# Patient Record
Sex: Male | Born: 1980 | Race: White | Hispanic: No | Marital: Married | Smoking: Never smoker
Health system: Southern US, Community
[De-identification: ages and names within clinical notes are randomized; demographics above are authoritative.]

## PROBLEM LIST (undated history)

## (undated) DIAGNOSIS — Z8701 Personal history of pneumonia (recurrent): Secondary | ICD-10-CM

## (undated) DIAGNOSIS — R569 Unspecified convulsions: Secondary | ICD-10-CM

## (undated) DIAGNOSIS — I10 Essential (primary) hypertension: Secondary | ICD-10-CM

## (undated) DIAGNOSIS — Z87442 Personal history of urinary calculi: Secondary | ICD-10-CM

## (undated) DIAGNOSIS — S83289A Other tear of lateral meniscus, current injury, unspecified knee, initial encounter: Secondary | ICD-10-CM

## (undated) DIAGNOSIS — Z8709 Personal history of other diseases of the respiratory system: Secondary | ICD-10-CM

## (undated) DIAGNOSIS — M94261 Chondromalacia, right knee: Secondary | ICD-10-CM

## (undated) DIAGNOSIS — K5792 Diverticulitis of intestine, part unspecified, without perforation or abscess without bleeding: Secondary | ICD-10-CM

## (undated) HISTORY — PX: COLONOSCOPY: SHX174

---

## 1999-05-12 HISTORY — PX: CHOLECYSTECTOMY: SHX55

## 1999-05-12 HISTORY — PX: KNEE ARTHROSCOPY W/ MENISCAL REPAIR: SHX1877

## 2011-11-09 DIAGNOSIS — K5792 Diverticulitis of intestine, part unspecified, without perforation or abscess without bleeding: Secondary | ICD-10-CM

## 2011-11-09 HISTORY — DX: Diverticulitis of intestine, part unspecified, without perforation or abscess without bleeding: K57.92

## 2012-09-10 DIAGNOSIS — Z8701 Personal history of pneumonia (recurrent): Secondary | ICD-10-CM

## 2012-09-10 HISTORY — DX: Personal history of pneumonia (recurrent): Z87.01

## 2013-01-31 DIAGNOSIS — R569 Unspecified convulsions: Secondary | ICD-10-CM

## 2013-01-31 HISTORY — DX: Unspecified convulsions: R56.9

## 2013-02-10 ENCOUNTER — Encounter (HOSPITAL_COMMUNITY): Payer: Self-pay | Admitting: Pharmacy Technician

## 2013-02-11 ENCOUNTER — Encounter (HOSPITAL_COMMUNITY): Payer: Self-pay

## 2013-02-11 ENCOUNTER — Encounter (HOSPITAL_COMMUNITY)
Admission: RE | Admit: 2013-02-11 | Discharge: 2013-02-11 | Disposition: A | Discharge status: Home / Self Care | Payer: BC Managed Care – PPO | Source: Ambulatory Visit | Attending: Orthopedic Surgery | Admitting: Orthopedic Surgery

## 2013-02-11 HISTORY — DX: Unspecified convulsions: R56.9

## 2013-02-11 HISTORY — DX: Diverticulitis of intestine, part unspecified, without perforation or abscess without bleeding: K57.92

## 2013-02-11 HISTORY — DX: Personal history of urinary calculi: Z87.442

## 2013-02-11 HISTORY — DX: Essential (primary) hypertension: I10

## 2013-02-11 LAB — SURGICAL PCR SCREEN: Staphylococcus aureus: NEGATIVE

## 2013-02-11 LAB — APTT: aPTT: 29 seconds (ref 24–37)

## 2013-02-11 LAB — CBC WITH DIFFERENTIAL/PLATELET
Basophils Absolute: 0 10*3/uL (ref 0.0–0.1)
Basophils Relative: 0 % (ref 0–1)
Hemoglobin: 15 g/dL (ref 13.0–17.0)
MCHC: 35 g/dL (ref 30.0–36.0)
Neutro Abs: 4.4 10*3/uL (ref 1.7–7.7)
Neutrophils Relative %: 55 % (ref 43–77)
RDW: 11.7 % (ref 11.5–15.5)
WBC: 8.1 10*3/uL (ref 4.0–10.5)

## 2013-02-11 LAB — TYPE AND SCREEN

## 2013-02-11 LAB — URINALYSIS, ROUTINE W REFLEX MICROSCOPIC
Ketones, ur: NEGATIVE mg/dL
Leukocytes, UA: NEGATIVE
Nitrite: NEGATIVE
Specific Gravity, Urine: 1.026 (ref 1.005–1.030)
pH: 6 (ref 5.0–8.0)

## 2013-02-11 LAB — COMPREHENSIVE METABOLIC PANEL
ALT: 53 U/L (ref 0–53)
AST: 23 U/L (ref 0–37)
Albumin: 3.8 g/dL (ref 3.5–5.2)
Alkaline Phosphatase: 83 U/L (ref 39–117)
Chloride: 101 mEq/L (ref 96–112)
Potassium: 3.8 mEq/L (ref 3.5–5.1)
Total Bilirubin: 0.2 mg/dL — ABNORMAL LOW (ref 0.3–1.2)

## 2013-02-11 MED ORDER — CHLORHEXIDINE GLUCONATE 4 % EX LIQD: 60.0000 mL | Freq: Once | CUTANEOUS | Status: DC

## 2013-02-11 NOTE — Progress Notes (Signed)
Requested orders from [place] at [facility] office

## 2013-02-11 NOTE — Progress Notes (Signed)
Pt doesn't have a cardiologist  Denies ever having an echo/heart cath/stress test/  Dr.Dough is [facility] in [place]  CXR on May 24,2014 in [place]

## 2013-02-11 NOTE — Pre-Procedure Instructions (Signed)
[place] E Heinz  02/11/2013   Your procedure is scheduled on:  Thurs, June 5 @ 8:00 AM  Report to [facility] at 6:00 AM.  Call this number if you have problems the morning of surgery: [phone]   Remember:   Do not eat food or drink liquids after midnight.   Take these medicines the morning of surgery with A SIP OF WATER: Pain Pill(if needed)   Do not wear jewelry  Do not wear lotions, powders, or colognes. You may wear deodorant.  Men may shave face and neck.  Do not bring valuables to the hospital.  [facility] is not responsible                   for any belongings or valuables.  Contacts, dentures or bridgework may not be worn into surgery.  Leave suitcase in the car. After surgery it may be brought to your room.  For patients admitted to the hospital, checkout time is 11:00 AM the day of  discharge.   Patients discharged the day of surgery will not be allowed to drive  home.    Special Instructions: Shower using CHG 2 nights before surgery and the night before surgery.  If you shower the day of surgery use CHG.  Use special wash - you have one bottle of CHG for all showers.  You should use approximately 1/3 of the bottle for each shower.   Please read over the following fact sheets that you were given: Pain Booklet, Coughing and Deep Breathing, MRSA Information and Surgical Site Infection Prevention

## 2013-02-12 ENCOUNTER — Ambulatory Visit (HOSPITAL_COMMUNITY): Payer: BC Managed Care – PPO | Admitting: Anesthesiology

## 2013-02-12 ENCOUNTER — Inpatient Hospital Stay (HOSPITAL_COMMUNITY): Payer: BC Managed Care – PPO

## 2013-02-12 ENCOUNTER — Inpatient Hospital Stay (HOSPITAL_COMMUNITY)
Admission: RE | Admit: 2013-02-12 | Discharge: 2013-02-15 | DRG: 218 | Disposition: A | Discharge status: Home / Self Care | Payer: BC Managed Care – PPO | Source: Ambulatory Visit | Attending: Orthopedic Surgery | Admitting: Orthopedic Surgery

## 2013-02-12 ENCOUNTER — Encounter (HOSPITAL_COMMUNITY): Payer: Self-pay | Admitting: *Deleted

## 2013-02-12 ENCOUNTER — Encounter (HOSPITAL_COMMUNITY): Payer: Self-pay | Admitting: Anesthesiology

## 2013-02-12 ENCOUNTER — Encounter (HOSPITAL_COMMUNITY)
Admission: RE | Disposition: A | Discharge status: Home / Self Care | Payer: Self-pay | Source: Ambulatory Visit | Attending: Orthopedic Surgery

## 2013-02-12 DIAGNOSIS — Y999 Unspecified external cause status: Secondary | ICD-10-CM

## 2013-02-12 DIAGNOSIS — E871 Hypo-osmolality and hyponatremia: Secondary | ICD-10-CM | POA: Diagnosis present

## 2013-02-12 DIAGNOSIS — R404 Transient alteration of awareness: Secondary | ICD-10-CM | POA: Diagnosis present

## 2013-02-12 DIAGNOSIS — S82141A Displaced bicondylar fracture of right tibia, initial encounter for closed fracture: Secondary | ICD-10-CM

## 2013-02-12 DIAGNOSIS — S82143A Displaced bicondylar fracture of unspecified tibia, initial encounter for closed fracture: Secondary | ICD-10-CM

## 2013-02-12 DIAGNOSIS — M25519 Pain in unspecified shoulder: Secondary | ICD-10-CM | POA: Diagnosis present

## 2013-02-12 DIAGNOSIS — Z7982 Long term (current) use of aspirin: Secondary | ICD-10-CM

## 2013-02-12 DIAGNOSIS — S82109A Unspecified fracture of upper end of unspecified tibia, initial encounter for closed fracture: Principal | ICD-10-CM | POA: Diagnosis present

## 2013-02-12 DIAGNOSIS — Z8709 Personal history of other diseases of the respiratory system: Secondary | ICD-10-CM

## 2013-02-12 DIAGNOSIS — I10 Essential (primary) hypertension: Secondary | ICD-10-CM

## 2013-02-12 DIAGNOSIS — Y9239 Other specified sports and athletic area as the place of occurrence of the external cause: Secondary | ICD-10-CM

## 2013-02-12 HISTORY — PX: ORIF TIBIA PLATEAU: SHX2132

## 2013-02-12 HISTORY — PX: FASCIOTOMY: SHX132

## 2013-02-12 HISTORY — PX: ORIF TIBIA & FIBULA FRACTURES: SHX2131

## 2013-02-12 LAB — CBC
HCT: 36.7 % — ABNORMAL LOW (ref 39.0–52.0)
Hemoglobin: 13 g/dL (ref 13.0–17.0)
MCH: 28.6 pg (ref 26.0–34.0)
MCHC: 35.4 g/dL (ref 30.0–36.0)
MCV: 80.7 fL (ref 78.0–100.0)
WBC: 9.4 10*3/uL (ref 4.0–10.5)

## 2013-02-12 LAB — CREATININE, SERUM: Creatinine, Ser: 0.71 mg/dL (ref 0.50–1.35)

## 2013-02-12 SURGERY — OPEN REDUCTION INTERNAL FIXATION (ORIF) TIBIAL PLATEAU
Anesthesia: General | Site: Leg Lower | Laterality: Right | Wound class: Clean

## 2013-02-12 MED ORDER — ONDANSETRON HCL 4 MG PO TABS: 4.0000 mg | ORAL_TABLET | Freq: Four times a day (QID) | ORAL | Status: DC | PRN

## 2013-02-12 MED ORDER — OXYCODONE HCL 5 MG PO TABS
5.0000 mg | ORAL_TABLET | Freq: Once | ORAL | Status: AC | PRN
  Administered 2013-02-12: 5 mg via ORAL

## 2013-02-12 MED ORDER — METHOCARBAMOL 500 MG PO TABS
1000.0000 mg | ORAL_TABLET | Freq: Four times a day (QID) | ORAL | Status: DC
  Administered 2013-02-12 – 2013-02-15 (×12): 1000 mg via ORAL
  Filled 2013-02-12 (×18): qty 2

## 2013-02-12 MED ORDER — NALOXONE HCL 0.4 MG/ML IJ SOLN: 0.4000 mg | INTRAMUSCULAR | Status: DC | PRN

## 2013-02-12 MED ORDER — PROPOFOL 10 MG/ML IV BOLUS
INTRAVENOUS | Status: DC | PRN
  Administered 2013-02-12: 50 mg via INTRAVENOUS
  Administered 2013-02-12: 150 mg via INTRAVENOUS

## 2013-02-12 MED ORDER — POLYETHYLENE GLYCOL 3350 17 G PO PACK
17.0000 g | PACK | Freq: Every day | ORAL | Status: DC
  Administered 2013-02-14 – 2013-02-15 (×2): 17 g via ORAL
  Filled 2013-02-12 (×4): qty 1

## 2013-02-12 MED ORDER — DIPHENHYDRAMINE HCL 50 MG/ML IJ SOLN: 12.5000 mg | Freq: Four times a day (QID) | INTRAMUSCULAR | Status: DC | PRN

## 2013-02-12 MED ORDER — METOCLOPRAMIDE HCL 5 MG/ML IJ SOLN: 5.0000 mg | Freq: Three times a day (TID) | INTRAMUSCULAR | Status: DC | PRN

## 2013-02-12 MED ORDER — GLYCOPYRROLATE 0.2 MG/ML IJ SOLN
INTRAMUSCULAR | Status: DC | PRN
  Administered 2013-02-12: 0.4 mg via INTRAVENOUS

## 2013-02-12 MED ORDER — SALINE SPRAY 0.65 % NA SOLN
1.0000 | NASAL | Status: DC | PRN
Start: 2013-02-12 — End: 2013-02-15
  Filled 2013-02-12: qty 44

## 2013-02-12 MED ORDER — OXYCODONE HCL 5 MG/5ML PO SOLN: 5.0000 mg | Freq: Once | ORAL | Status: AC | PRN

## 2013-02-12 MED ORDER — CEFAZOLIN SODIUM-DEXTROSE 2-3 GM-% IV SOLR
2.0000 g | INTRAVENOUS | Status: AC
  Administered 2013-02-12: 2 g via INTRAVENOUS

## 2013-02-12 MED ORDER — SORBITOL 70 % SOLN: 30.0000 mL | Freq: Every day | Status: DC | PRN

## 2013-02-12 MED ORDER — LACTATED RINGERS IV SOLN
INTRAVENOUS | Status: DC | PRN
  Administered 2013-02-12 (×3): via INTRAVENOUS

## 2013-02-12 MED ORDER — HYDROMORPHONE HCL PF 1 MG/ML IJ SOLN
0.2500 mg | INTRAMUSCULAR | Status: DC | PRN
  Administered 2013-02-12 (×6): 0.5 mg via INTRAVENOUS

## 2013-02-12 MED ORDER — ONDANSETRON HCL 4 MG/2ML IJ SOLN
INTRAMUSCULAR | Status: DC | PRN
  Administered 2013-02-12: 4 mg via INTRAVENOUS

## 2013-02-12 MED ORDER — HYDROMORPHONE 0.3 MG/ML IV SOLN
INTRAVENOUS | Status: AC
  Filled 2013-02-12: qty 25

## 2013-02-12 MED ORDER — CEFAZOLIN SODIUM-DEXTROSE 2-3 GM-% IV SOLR
2.0000 g | Freq: Four times a day (QID) | INTRAVENOUS | Status: AC
  Administered 2013-02-12 – 2013-02-13 (×3): 2 g via INTRAVENOUS
  Filled 2013-02-12 (×3): qty 50

## 2013-02-12 MED ORDER — HYDROMORPHONE 0.3 MG/ML IV SOLN
INTRAVENOUS | Status: DC
  Administered 2013-02-12 (×2): via INTRAVENOUS
  Administered 2013-02-12: 2.7 mg via INTRAVENOUS
  Administered 2013-02-13: 04:00:00 via INTRAVENOUS
  Administered 2013-02-13: 3.6 mg via INTRAVENOUS
  Administered 2013-02-13: 2.7 mg via INTRAVENOUS
  Filled 2013-02-12 (×2): qty 25

## 2013-02-12 MED ORDER — ENOXAPARIN SODIUM 40 MG/0.4ML ~~LOC~~ SOLN
40.0000 mg | SUBCUTANEOUS | Status: DC
  Administered 2013-02-12 – 2013-02-14 (×3): 40 mg via SUBCUTANEOUS
  Filled 2013-02-12 (×4): qty 0.4

## 2013-02-12 MED ORDER — NEOSTIGMINE METHYLSULFATE 1 MG/ML IJ SOLN
INTRAMUSCULAR | Status: DC | PRN
  Administered 2013-02-12: 3 mg via INTRAVENOUS

## 2013-02-12 MED ORDER — ONDANSETRON HCL 4 MG/2ML IJ SOLN
4.0000 mg | Freq: Four times a day (QID) | INTRAMUSCULAR | Status: DC | PRN
  Administered 2013-02-12 – 2013-02-13 (×2): 4 mg via INTRAVENOUS
  Filled 2013-02-12 (×2): qty 2

## 2013-02-12 MED ORDER — METHOCARBAMOL 100 MG/ML IJ SOLN
1000.0000 mg | Freq: Four times a day (QID) | INTRAVENOUS | Status: DC
  Filled 2013-02-12 (×16): qty 10

## 2013-02-12 MED ORDER — OXYCODONE HCL 5 MG PO TABS
5.0000 mg | ORAL_TABLET | ORAL | Status: DC | PRN
  Administered 2013-02-13 (×4): 15 mg via ORAL
  Administered 2013-02-14: 10 mg via ORAL
  Administered 2013-02-14: 15 mg via ORAL
  Filled 2013-02-12: qty 3
  Filled 2013-02-12: qty 2
  Filled 2013-02-12 (×4): qty 3

## 2013-02-12 MED ORDER — MIDAZOLAM HCL 5 MG/5ML IJ SOLN
INTRAMUSCULAR | Status: DC | PRN
  Administered 2013-02-12: 2 mg via INTRAVENOUS

## 2013-02-12 MED ORDER — HYDROMORPHONE HCL PF 1 MG/ML IJ SOLN
INTRAMUSCULAR | Status: AC
  Filled 2013-02-12: qty 1

## 2013-02-12 MED ORDER — LACTATED RINGERS IV SOLN: INTRAVENOUS | Status: DC

## 2013-02-12 MED ORDER — LIDOCAINE HCL (CARDIAC) 20 MG/ML IV SOLN
INTRAVENOUS | Status: DC | PRN
  Administered 2013-02-12: 100 mg via INTRAVENOUS

## 2013-02-12 MED ORDER — DIPHENHYDRAMINE HCL 12.5 MG/5ML PO ELIX: 12.5000 mg | ORAL_SOLUTION | ORAL | Status: DC | PRN

## 2013-02-12 MED ORDER — CEFAZOLIN SODIUM-DEXTROSE 2-3 GM-% IV SOLR
INTRAVENOUS | Status: AC
  Filled 2013-02-12: qty 50

## 2013-02-12 MED ORDER — FENTANYL CITRATE 0.05 MG/ML IJ SOLN
INTRAMUSCULAR | Status: DC | PRN
  Administered 2013-02-12: 150 ug via INTRAVENOUS
  Administered 2013-02-12 (×6): 50 ug via INTRAVENOUS

## 2013-02-12 MED ORDER — DIPHENHYDRAMINE HCL 12.5 MG/5ML PO ELIX: 12.5000 mg | ORAL_SOLUTION | Freq: Four times a day (QID) | ORAL | Status: DC | PRN

## 2013-02-12 MED ORDER — OXYCODONE HCL 5 MG PO TABS
ORAL_TABLET | ORAL | Status: AC
  Filled 2013-02-12: qty 1

## 2013-02-12 MED ORDER — ACETAMINOPHEN 10 MG/ML IV SOLN
1000.0000 mg | Freq: Four times a day (QID) | INTRAVENOUS | Status: DC
  Administered 2013-02-12: 1000 mg via INTRAVENOUS
  Filled 2013-02-12: qty 100

## 2013-02-12 MED ORDER — ROCURONIUM BROMIDE 100 MG/10ML IV SOLN
INTRAVENOUS | Status: DC | PRN
  Administered 2013-02-12 (×2): 10 mg via INTRAVENOUS
  Administered 2013-02-12: 50 mg via INTRAVENOUS

## 2013-02-12 MED ORDER — METOCLOPRAMIDE HCL 10 MG PO TABS: 5.0000 mg | ORAL_TABLET | Freq: Three times a day (TID) | ORAL | Status: DC | PRN

## 2013-02-12 MED ORDER — SALINE NASAL SPRAY 0.65 % NA SOLN: 1.0000 | NASAL | Status: DC | PRN

## 2013-02-12 MED ORDER — PROMETHAZINE HCL 25 MG/ML IJ SOLN: 6.2500 mg | INTRAMUSCULAR | Status: DC | PRN

## 2013-02-12 MED ORDER — POTASSIUM CHLORIDE IN NACL 20-0.9 MEQ/L-% IV SOLN
INTRAVENOUS | Status: DC
  Administered 2013-02-12 (×2): via INTRAVENOUS
  Filled 2013-02-12 (×2): qty 1000

## 2013-02-12 MED ORDER — DOCUSATE SODIUM 100 MG PO CAPS
100.0000 mg | ORAL_CAPSULE | Freq: Two times a day (BID) | ORAL | Status: DC
  Administered 2013-02-12 – 2013-02-15 (×7): 100 mg via ORAL
  Filled 2013-02-12 (×8): qty 1

## 2013-02-12 MED ORDER — 0.9 % SODIUM CHLORIDE (POUR BTL) OPTIME
TOPICAL | Status: DC | PRN
  Administered 2013-02-12: 1000 mL

## 2013-02-12 MED ORDER — ACETAMINOPHEN 10 MG/ML IV SOLN
1000.0000 mg | Freq: Four times a day (QID) | INTRAVENOUS | Status: AC
  Administered 2013-02-12 – 2013-02-13 (×3): 1000 mg via INTRAVENOUS
  Filled 2013-02-12 (×3): qty 100

## 2013-02-12 MED ORDER — TEMAZEPAM 15 MG PO CAPS
15.0000 mg | ORAL_CAPSULE | Freq: Every evening | ORAL | Status: DC | PRN
  Administered 2013-02-14: 15 mg via ORAL
  Filled 2013-02-12: qty 1

## 2013-02-12 MED ORDER — ACETAMINOPHEN 10 MG/ML IV SOLN
INTRAVENOUS | Status: AC
  Filled 2013-02-12: qty 100

## 2013-02-12 MED ORDER — ONDANSETRON HCL 4 MG/2ML IJ SOLN: 4.0000 mg | Freq: Four times a day (QID) | INTRAMUSCULAR | Status: DC | PRN

## 2013-02-12 MED ORDER — SODIUM CHLORIDE 0.9 % IJ SOLN: 9.0000 mL | INTRAMUSCULAR | Status: DC | PRN

## 2013-02-12 SURGICAL SUPPLY — 98 items
BANDAGE ELASTIC 4 VELCRO ST LF (GAUZE/BANDAGES/DRESSINGS) ×2 IMPLANT
BANDAGE ELASTIC 6 VELCRO ST LF (GAUZE/BANDAGES/DRESSINGS) ×2 IMPLANT
BANDAGE ESMARK 6X9 LF (GAUZE/BANDAGES/DRESSINGS) ×1 IMPLANT
BANDAGE GAUZE ELAST BULKY 4 IN (GAUZE/BANDAGES/DRESSINGS) ×2 IMPLANT
BIT DRILL 100X2.5XANTM LCK (BIT) ×2 IMPLANT
BIT DRILL CAL (BIT) ×1 IMPLANT
BIT DRL 100X2.5XANTM LCK (BIT) ×2
BLADE SURG 10 STRL SS (BLADE) ×2 IMPLANT
BLADE SURG 15 STRL LF DISP TIS (BLADE) IMPLANT
BLADE SURG 15 STRL SS (BLADE)
BLADE SURG ROTATE 9660 (MISCELLANEOUS) ×2 IMPLANT
BNDG COHESIVE 4X5 TAN STRL (GAUZE/BANDAGES/DRESSINGS) ×2 IMPLANT
BNDG ESMARK 6X9 LF (GAUZE/BANDAGES/DRESSINGS) ×2
BRUSH SCRUB DISP (MISCELLANEOUS) ×4 IMPLANT
CANISTER WOUND CARE 500ML ATS (WOUND CARE) IMPLANT
CEMENT CAL PHOSPHATE 5CC (Cement) ×2 IMPLANT
CLOTH BEACON ORANGE TIMEOUT ST (SAFETY) ×2 IMPLANT
COVER MAYO STAND STRL (DRAPES) IMPLANT
COVER SURGICAL LIGHT HANDLE (MISCELLANEOUS) ×2 IMPLANT
CUFF TOURNIQUET SINGLE 24IN (TOURNIQUET CUFF) IMPLANT
CUFF TOURNIQUET SINGLE 34IN LL (TOURNIQUET CUFF) ×2 IMPLANT
DRAPE C-ARM 42X72 X-RAY (DRAPES) ×2 IMPLANT
DRAPE C-ARMOR (DRAPES) ×2 IMPLANT
DRAPE INCISE IOBAN 66X45 STRL (DRAPES) IMPLANT
DRAPE ORTHO SPLIT 77X108 STRL (DRAPES)
DRAPE SURG ORHT 6 SPLT 77X108 (DRAPES) IMPLANT
DRAPE U-SHAPE 47X51 STRL (DRAPES) ×2 IMPLANT
DRILL BIT 2.5MM (BIT) ×2
DRILL BIT CAL (BIT) ×2
DRSG ADAPTIC 3X8 NADH LF (GAUZE/BANDAGES/DRESSINGS) ×2 IMPLANT
DRSG PAD ABDOMINAL 8X10 ST (GAUZE/BANDAGES/DRESSINGS) ×4 IMPLANT
DRSG VAC ATS LRG SENSATRAC (GAUZE/BANDAGES/DRESSINGS) IMPLANT
DRSG VAC ATS MED SENSATRAC (GAUZE/BANDAGES/DRESSINGS) IMPLANT
DRSG VAC ATS SM SENSATRAC (GAUZE/BANDAGES/DRESSINGS) IMPLANT
ELECT REM PT RETURN 9FT ADLT (ELECTROSURGICAL) ×2
ELECTRODE REM PT RTRN 9FT ADLT (ELECTROSURGICAL) ×1 IMPLANT
EVACUATOR 1/8 PVC DRAIN (DRAIN) ×2 IMPLANT
EVACUATOR 3/16  PVC DRAIN (DRAIN)
EVACUATOR 3/16 PVC DRAIN (DRAIN) IMPLANT
GLOVE BIO SURGEON STRL SZ7.5 (GLOVE) ×2 IMPLANT
GLOVE BIO SURGEON STRL SZ8 (GLOVE) ×2 IMPLANT
GLOVE BIOGEL PI IND STRL 7.5 (GLOVE) ×1 IMPLANT
GLOVE BIOGEL PI IND STRL 8 (GLOVE) ×1 IMPLANT
GLOVE BIOGEL PI INDICATOR 7.5 (GLOVE) ×1
GLOVE BIOGEL PI INDICATOR 8 (GLOVE) ×1
GOWN PREVENTION PLUS XLARGE (GOWN DISPOSABLE) ×2 IMPLANT
GOWN STRL NON-REIN LRG LVL3 (GOWN DISPOSABLE) ×4 IMPLANT
IMMOBILIZER KNEE 22 UNIV (SOFTGOODS) ×2 IMPLANT
K-WIRE ACE 1.6X6 (WIRE) ×4
KIT BASIN OR (CUSTOM PROCEDURE TRAY) ×2 IMPLANT
KIT ROOM TURNOVER OR (KITS) ×2 IMPLANT
KWIRE ACE 1.6X6 (WIRE) ×2 IMPLANT
MANIFOLD NEPTUNE II (INSTRUMENTS) ×2 IMPLANT
NDL SUT 6 .5 CRC .975X.05 MAYO (NEEDLE) ×1 IMPLANT
NEEDLE 22X1 1/2 (OR ONLY) (NEEDLE) IMPLANT
NEEDLE MAYO TAPER (NEEDLE) ×1
NS IRRIG 1000ML POUR BTL (IV SOLUTION) ×2 IMPLANT
PACK GENERAL/GYN (CUSTOM PROCEDURE TRAY) IMPLANT
PACK ORTHO EXTREMITY (CUSTOM PROCEDURE TRAY) ×2 IMPLANT
PAD ARMBOARD 7.5X6 YLW CONV (MISCELLANEOUS) ×4 IMPLANT
PAD CAST 4YDX4 CTTN HI CHSV (CAST SUPPLIES) IMPLANT
PADDING CAST COTTON 4X4 STRL (CAST SUPPLIES)
PADDING CAST COTTON 6X4 STRL (CAST SUPPLIES) IMPLANT
PLATE LOCK 5H STD RT PROX TIB (Plate) ×2 IMPLANT
SCREW CORTICAL 3.5MM  44MM (Screw) ×1 IMPLANT
SCREW CORTICAL 3.5MM 36MM (Screw) ×4 IMPLANT
SCREW CORTICAL 3.5MM 44MM (Screw) ×1 IMPLANT
SCREW LOCK CORT STAR 3.5X50 (Screw) ×2 IMPLANT
SCREW LOCK CORT STAR 3.5X65 (Screw) ×2 IMPLANT
SCREW LOCK CORT STAR 3.5X75 (Screw) ×4 IMPLANT
SCREW LOW PROF CORTICAL 3.5X80 (Screw) ×4 IMPLANT
SCREW LP 3.5X85MM (Screw) ×2 IMPLANT
SET MONITOR QUICK PRESSURE (MISCELLANEOUS) IMPLANT
SPONGE GAUZE 4X4 12PLY (GAUZE/BANDAGES/DRESSINGS) ×2 IMPLANT
SPONGE LAP 18X18 X RAY DECT (DISPOSABLE) ×4 IMPLANT
STAPLER VISISTAT 35W (STAPLE) IMPLANT
STOCKINETTE IMPERVIOUS 9X36 MD (GAUZE/BANDAGES/DRESSINGS) IMPLANT
STOCKINETTE IMPERVIOUS LG (DRAPES) ×2 IMPLANT
SUCTION FRAZIER TIP 10 FR DISP (SUCTIONS) ×2 IMPLANT
SUT ETHILON 2 0 FSLX (SUTURE) IMPLANT
SUT ETHILON 3 0 PS 1 (SUTURE) ×4 IMPLANT
SUT PROLENE 0 CT 2 (SUTURE) ×4 IMPLANT
SUT VIC AB 0 CT1 27 (SUTURE) ×1
SUT VIC AB 0 CT1 27XBRD ANBCTR (SUTURE) ×1 IMPLANT
SUT VIC AB 0 CTB1 27 (SUTURE) IMPLANT
SUT VIC AB 1 CT1 27 (SUTURE) ×1
SUT VIC AB 1 CT1 27XBRD ANBCTR (SUTURE) ×1 IMPLANT
SUT VIC AB 2-0 CT1 27 (SUTURE) ×2
SUT VIC AB 2-0 CT1 TAPERPNT 27 (SUTURE) ×2 IMPLANT
SUT VIC AB 2-0 CT3 27 (SUTURE) IMPLANT
SUT VIC AB 2-0 CTB1 (SUTURE) IMPLANT
SYR 20ML ECCENTRIC (SYRINGE) IMPLANT
TOWEL OR 17X24 6PK STRL BLUE (TOWEL DISPOSABLE) ×2 IMPLANT
TOWEL OR 17X26 10 PK STRL BLUE (TOWEL DISPOSABLE) ×4 IMPLANT
TRAY FOLEY CATH 14FR (SET/KITS/TRAYS/PACK) ×2 IMPLANT
TUBE CONNECTING 12X1/4 (SUCTIONS) ×2 IMPLANT
WATER STERILE IRR 1000ML POUR (IV SOLUTION) ×4 IMPLANT
YANKAUER SUCT BULB TIP NO VENT (SUCTIONS) ×2 IMPLANT

## 2013-02-12 NOTE — Progress Notes (Signed)
Orthopedic Tech Progress Note Patient Details:  Charles Swanson 1980/09/15 [id number]  Patient ID: Charles E Paro, male   DOB: 04-11-1981, 32 y.o.   MRN: [id number]   [name] 02/12/2013, 1:23 PM Called [employer] right hinged knee brace.
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
## 2013-02-12 NOTE — Transfer of Care (Signed)
Immediate Anesthesia Transfer of Care Note  Patient: Charles Swanson  Procedure(s) Performed: Procedure(s): OPEN REDUCTION INTERNAL FIXATION (ORIF) RIGHT TIBIAL PLATEAU (Right) ANTERIOR COMPARTMENT FASCIOTOMY (Right)  Patient Location: PACU  Anesthesia Type:General  Level of Consciousness: awake, alert , oriented and patient cooperative  Airway & Oxygen Therapy: Patient Spontanous Breathing and Patient connected to nasal cannula oxygen  Post-op Assessment: Report given to PACU RN, Post -op Vital signs reviewed and stable and Patient moving all extremities X 4  Post vital signs: Reviewed and stable  Complications: No apparent anesthesia complications

## 2013-02-12 NOTE — Brief Op Note (Signed)
02/12/2013  10:37 AM  PATIENT:  [place] E Vigna  32 y.o. male  PRE-OPERATIVE DIAGNOSIS:  RIGHT LATERAL TIBIAL PLATEAU FX   POST-OPERATIVE DIAGNOSIS:  right lateral tibial plateau fracture  PROCEDURE:  Procedure(s): OPEN REDUCTION INTERNAL FIXATION (ORIF) RIGHT TIBIAL PLATEAU (Right) ANTERIOR COMPARTMENT FASCIOTOMY (Right)  SURGEON:  Surgeon(s) and Role:    * Budd Palmer, MD - Primary  PHYSICIAN ASSISTANT: Montez Morita, [facility]  ANESTHESIA:   general  EBL:  Total I/O In: 2000 [I.V.:2000] Out: 225 [Urine:225]  BLOOD ADMINISTERED:none  DRAINS: one anterior compartment   LOCAL MEDICATIONS USED:  NONE  SPECIMEN:  No Specimen  DISPOSITION OF SPECIMEN:  N/A  COUNTS:  YES  TOURNIQUET:   Total Tourniquet Time Documented: Thigh (Right) - 69 minutes Total: Thigh (Right) - 69 minutes   DICTATION: .Other Dictation: Dictation Number [id number]  PLAN OF CARE: Admit to inpatient   PATIENT DISPOSITION:  PACU - hemodynamically stable.   Delay start of Pharmacological VTE agent (>24hrs) due to surgical blood loss or risk of bleeding: no

## 2013-02-12 NOTE — Anesthesia Procedure Notes (Signed)
Procedure Name: Intubation Date/Time: 02/12/2013 8:20 AM Performed by: Rogelia Boga Pre-anesthesia Checklist: Patient identified, Emergency Drugs available, Suction available, Patient being monitored and Timeout performed Patient Re-evaluated:Patient Re-evaluated prior to inductionOxygen Delivery Method: Circle system utilized Preoxygenation: Pre-oxygenation with 100% oxygen Intubation Type: IV induction Ventilation: Mask ventilation without difficulty Laryngoscope Size: Mac and 4 Grade View: Grade I Tube type: Oral Tube size: 7.5 mm Number of attempts: 1 Airway Equipment and Method: Stylet Placement Confirmation: ETT inserted through vocal cords under direct vision,  positive ETCO2 and breath sounds checked- equal and bilateral Secured at: 22 cm Tube secured with: Tape Dental Injury: Teeth and Oropharynx as per pre-operative assessment

## 2013-02-12 NOTE — Preoperative (Signed)
Beta Blockers   Reason not to administer Beta Blockers:Not Applicable 
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
## 2013-02-12 NOTE — Anesthesia Postprocedure Evaluation (Signed)
Anesthesia Post Note  Patient: Charles Swanson  Procedure(s) Performed: Procedure(s) (LRB): OPEN REDUCTION INTERNAL FIXATION (ORIF) RIGHT TIBIAL PLATEAU (Right) ANTERIOR COMPARTMENT FASCIOTOMY (Right)  Anesthesia type: general  Patient location: PACU  Post pain: Pain level controlled  Post assessment: Patient's Cardiovascular Status Stable  Last Vitals:  Filed Vitals:   02/12/13 1130  BP: 161/93  Pulse: 73  Temp:   Resp: 10    Post vital signs: Reviewed and stable  Level of consciousness: sedated  Complications: No apparent anesthesia complications

## 2013-02-12 NOTE — H&P (Signed)
I have seen and examined the patient. I agree with the findings above.  R shoulder pain without effect on breathing; no tenderness, no weakness  I discussed with the patient the risks and benefits of surgery for tibial plateau fracture, including the possibility of infection, nerve injury, vessel injury, wound breakdown, arthritis, symptomatic hardware, DVT/ PE, loss of motion, and need for further surgery among others.  We also specifically discussed the elevated risk of soft tissue breakdown.  He understood these risks and wished to proceed.   Budd Palmer, MD 02/12/2013 8:00 AM

## 2013-02-13 ENCOUNTER — Encounter (HOSPITAL_COMMUNITY): Payer: Self-pay | Admitting: Orthopedic Surgery

## 2013-02-13 DIAGNOSIS — I10 Essential (primary) hypertension: Secondary | ICD-10-CM

## 2013-02-13 DIAGNOSIS — S82143A Displaced bicondylar fracture of unspecified tibia, initial encounter for closed fracture: Secondary | ICD-10-CM

## 2013-02-13 LAB — BASIC METABOLIC PANEL
Calcium: 9.1 mg/dL (ref 8.4–10.5)
Chloride: 97 mEq/L (ref 96–112)
Creatinine, Ser: 0.67 mg/dL (ref 0.50–1.35)
GFR calc Af Amer: >90 mL/min (ref >90)
GFR calc non Af Amer: >90 mL/min (ref >90)

## 2013-02-13 LAB — CBC
Platelets: 328 10*3/uL (ref 150–400)
RDW: 11.9 % (ref 11.5–15.5)
WBC: 11.9 10*3/uL — ABNORMAL HIGH (ref 4.0–10.5)

## 2013-02-13 MED ORDER — ENOXAPARIN SODIUM 40 MG/0.4ML ~~LOC~~ SOLN: 40.0000 mg | Freq: Every day | SUBCUTANEOUS | Status: DC

## 2013-02-13 MED ORDER — OXYCODONE-ACETAMINOPHEN 5-325 MG PO TABS
1.0000 | ORAL_TABLET | Freq: Four times a day (QID) | ORAL | Status: DC | PRN
  Administered 2013-02-13 – 2013-02-15 (×5): 2 via ORAL
  Filled 2013-02-13 (×6): qty 2

## 2013-02-13 MED ORDER — OXYCODONE-ACETAMINOPHEN 10-325 MG PO TABS: 1.0000 | ORAL_TABLET | Freq: Four times a day (QID) | ORAL | Status: DC | PRN

## 2013-02-13 MED ORDER — OXYCODONE HCL 5 MG PO TABS: 5.0000 mg | ORAL_TABLET | Freq: Four times a day (QID) | ORAL | Status: DC | PRN

## 2013-02-13 MED ORDER — ACETAMINOPHEN 325 MG PO TABS
325.0000 mg | ORAL_TABLET | Freq: Four times a day (QID) | ORAL | Status: DC | PRN
  Administered 2013-02-13: 650 mg via ORAL
  Administered 2013-02-14: 325 mg via ORAL
  Filled 2013-02-13: qty 2
  Filled 2013-02-13: qty 1

## 2013-02-13 MED ORDER — OXYCODONE HCL 5 MG PO TABS: 5.0000 mg | ORAL_TABLET | ORAL | Status: DC | PRN

## 2013-02-13 MED ORDER — METHOCARBAMOL 500 MG PO TABS: 500.0000 mg | ORAL_TABLET | Freq: Four times a day (QID) | ORAL | Status: DC

## 2013-02-13 MED ORDER — POLYETHYLENE GLYCOL 3350 17 G PO PACK: 17.0000 g | PACK | Freq: Every day | ORAL | Status: DC

## 2013-02-13 NOTE — Progress Notes (Signed)
Orthopedic Tech Progress Note Patient Details:  Charles Swanson 1981-07-17 [id number]  Patient ID: Charles Swanson, male   DOB: 03-10-1981, 31 y.o.   MRN: [id number]   [name] 02/13/2013, 1:49 PM bledsoe knee brace completed by advanced.
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
## 2013-02-13 NOTE — Progress Notes (Signed)
Orthopaedic Trauma Service Progress Note     1 Day Post-Op  Subjective   Minimal sleep overnight C/o significant pain in R leg Some N/V this am Feeling better now Does not want to go home   Denies CP or SOB No abd pain  No H/A, lightheadedness or vision changes   Denies numbness or tingling   Objective  BP 135/87  Pulse 78  Temp(Src) 98.9 F (37.2 C) (Oral)  Resp 16  SpO2 96%  Intake/Output     06/05 0701 - 06/06 0700 06/06 0701 - 06/07 0700   P.O. 680    I.V. 2800    Total Intake 3480     Urine 1335    Drains 85    Total Output 1420     Net +2060            Labs Results for [place], [place] E (MRN [id number]) as of 02/13/2013 08:48  Ref. Range 02/13/2013 05:50  Sodium Latest Range: 135-145 mEq/L 132 (L)  Potassium Latest Range: 3.5-5.1 mEq/L 4.4  Chloride Latest Range: 96-112 mEq/L 97  CO2 Latest Range: 19-32 mEq/L 24  BUN Latest Range: 6-23 mg/dL 9  Creatinine Latest Range: 0.50-1.35 mg/dL 9.62  Calcium Latest Range: 8.4-10.5 mg/dL 9.1  GFR calc non Af Amer Latest Range: >90 mL/min >90  GFR calc Af Amer Latest Range: >90 mL/min >90  Glucose Latest Range: 70-99 mg/dL 95  WBC Latest Range: 9.5-28.4 K/uL 11.9 (H)  RBC Latest Range: 4.22-5.81 MIL/uL 4.72  Hemoglobin Latest Range: 13.0-17.0 g/dL 13.2  HCT Latest Range: 39.0-52.0 % 38.7 (L)  MCV Latest Range: 78.0-100.0 fL 82.0  MCH Latest Range: 26.0-34.0 pg 28.4  MCHC Latest Range: 30.0-36.0 g/dL 44.0  RDW Latest Range: 11.5-15.5 % 11.9  Platelets Latest Range: 150-400 K/uL 328    Exam  Gen: awake and alert, resting in bed Lungs: clear Cardiac: s1 and s2, RRR Abd: soft, NTND, + BS Ext:           Right Lower Extremity    Dressing c/d/i  Distal motor and sensory functions intact  No pain with passive stretch   Swelling stable  Ext warm  + DP pulse  Compartments soft and NT  Drain patent   Assessment and Plan  1 Day Post-Op  32 y/o male s/p ORIF R tibial plateau  1. R tibial plateau fracture  POD 1   NWB  X 6 weeks  Hinged brace ordered  ROM as tolerate  Ice and elevate  PT/OT consults  Dressing change tomorrow, d/c drain tomorrow   2. HTN  Stable  3. Pain management:  D/c PCA today  Transition to PO meds  4. DVT/PE prophylaxis:  Lovenox  Will d/c home with 14 days of lovenox 5. ID:   Ancef for post op abx coverage  6. Activity:  PT/OT evals  OOB as toleratd  NWB R leg  7. FEN/Foley/Lines:  Hyponatremia- mild, will d/c IVF, NSL IV  Reg diet  8. Dispo:  Possibly home tomorrow    Mearl Latin, PA-C [facility] [phone] (P) 02/13/2013 8:47 AM

## 2013-02-13 NOTE — Discharge Summary (Signed)
[facility] (OTS)  Patient ID: Charles Swanson MRN: [id number] DOB/AGE: 1981/02/24 32 y.o.  Admit date: 02/12/2013 Discharge date: 02/15/2013  Admission Diagnoses: Right Tibial plateau fracture HTN  Discharge Diagnoses:  Principal Problem:   Tibial plateau fracture Active Problems:   HTN (hypertension)   Procedures Performed: 02/12/2013- Dr. Carola Frost 1. Open reduction and internal fixation of right lateral tibial  plateau.  2. Anterior compartment fasciotomy  Discharged Condition: good  Hospital Course:   Pt had an uncomplicated hospital course.  He did well on POD 1 but was having moderate pain and requiring IV pain meds.  He was slow to mobilize with PT due to pain. Pt continued to progress well over the next several days and on POD 3 he was stable for d/c to home.  He was tolerating a regular diet, voiding on his own and mobilizing safely at the time of discharge   Consults: None  Significant Diagnostic Studies:  Labs   Treatments: IV hydration, antibiotics: Ancef, analgesia: acetaminophen, Dilaudid and percocet, oxy IR, anticoagulation: LMW heparin, therapies: PT, OT and RN and surgery: as above   Discharge Exam:   Subjective:  Patient reports pain as mild to moderate.  He states tha he is better today and was able to sleep last night.  Objective:   VITALS:   Filed Vitals:     02/14/13 0655  02/14/13 1428  02/14/13 2054  02/15/13 0612   BP:  139/86  136/81  152/93  127/82   Pulse:  95  108  110  89   Temp:  98.4 F (36.9 C)  99 F (37.2 C)  98.3 F (36.8 C)  98.6 F (37 C)   TempSrc:  Oral  Oral  Oral  Oral   Resp:  18  20  18  18    SpO2:  98%  98%  98%  96%     ABD soft Sensation intact distally Dorsiflexion/Plantar flexion intact Incision: dressing C/D/I and no drainage     Lab Results   Component  Value  Date     WBC  11.0*  02/14/2013     HGB  13.2  02/14/2013     HCT  38.5*  02/14/2013     MCV  82.8  02/14/2013     PLT  335   02/14/2013      Assessment/Plan: 3 Days Post-Op   Principal Problem:   Tibial plateau fracture Active Problems:   HTN (hypertension)   Advance diet Up with therapy Discharge home with home health Meds in chart per Dr Carola Frost and DC per his orders   Haskel Khan 02/15/2013, 9:31 AM   Teryl Lucy, MD Cell [phone]     Disposition: home   Discharge Orders   Future Orders Complete By Expires     Call MD / Call 911  As directed     Comments:      If you experience chest pain or shortness of breath, CALL 911 and be transported to the hospital emergency room.  If you develope a fever above 101 F, pus (white drainage) or increased drainage or redness at the wound, or calf pain, call your surgeon's office.    Constipation Prevention  As directed     Comments:      Drink plenty of fluids.  Prune juice may be helpful.  You may use a stool softener, such as Colace (over the counter) 100 mg twice a day.  Use  MiraLax (over the counter) for constipation as needed.    Diet general  As directed     Discharge instructions  As directed     Comments:      [facility] Discharge Instructions   General Discharge Instructions  WEIGHT BEARING STATUS: Nonweightbearing Right Leg x 6 weeks  RANGE OF MOTION/ACTIVITY: Range of motion Right knee as tolerated. Goal is to get leg to full extension (fully straight) as soon as possible and 90 degrees of flexion (bending) by 4 weeks post op.   Wound care: please see instructions below.  Can start daily dressing changes upon arrival home   Diet: as you were eating previously.  Can use over the counter stool softeners and bowel preparations, such as Miralax, to help with bowel movements.  Narcotics can be constipating.  Be sure to drink plenty of fluids  STOP SMOKING OR USING NICOTINE PRODUCTS!!!!  As discussed nicotine severely impairs your body's ability to heal surgical and traumatic wounds but also impairs bone  healing.  Wounds and bone heal by forming microscopic blood vessels (angiogenesis) and nicotine is a vasoconstrictor (essentially, shrinks blood vessels).  Therefore, if vasoconstriction occurs to these microscopic blood vessels they essentially disappear and are unable to deliver necessary nutrients to the healing tissue.  This is one modifiable factor that you can do to dramatically increase your chances of healing your injury.    (This means no smoking, no nicotine gum, patches, etc)  DO NOT USE NONSTEROIDAL ANTI-INFLAMMATORY DRUGS (NSAID'S)  Using products such as Advil (ibuprofen), Aleve (naproxen), Motrin (ibuprofen) for additional pain control during fracture healing can delay and/or prevent the healing response.  If you would like to take over the counter (OTC) medication, Tylenol (acetaminophen) is ok.  However, some narcotic medications that are given for pain control contain acetaminophen as well. Therefore, you should not exceed more than 4000 mg of tylenol in a day if you do not have liver disease.  Also note that there are may OTC medicines, such as cold medicines and allergy medicines that my contain tylenol as well.  If you have any questions about medications and/or interactions please ask your doctor/PA or your pharmacist.   PAIN MEDICATION USE AND EXPECTATIONS  You have likely been given narcotic medications to help control your pain.  After a traumatic event that results in an fracture (broken bone) with or without surgery, it is ok to use narcotic pain medications to help control one's pain.  We understand that everyone responds to pain differently and each individual patient will be evaluated on a regular basis for the continued need for narcotic medications. Ideally, narcotic medication use should last no more than 6-8 weeks (coinciding with fracture healing).   As a patient it is your responsibility as well to monitor narcotic medication use and report the amount and frequency you use  these medications when you come to your office visit.   We would also advise that if you are using narcotic medications, you should take a dose prior to therapy to maximize you participation.  IF YOU ARE ON NARCOTIC MEDICATIONS IT IS NOT PERMISSIBLE TO OPERATE A MOTOR VEHICLE (MOTORCYCLE/CAR/TRUCK/MOPED) OR HEAVY MACHINERY DO NOT MIX NARCOTICS WITH OTHER CNS (CENTRAL NERVOUS SYSTEM) DEPRESSANTS SUCH AS ALCOHOL       ICE AND ELEVATE INJURED/OPERATIVE EXTREMITY  Using ice and elevating the injured extremity above your heart can help with swelling and pain control.  Icing in a pulsatile fashion, such as 20 minutes on and  20 minutes off, can be followed.    Do not place ice directly on skin. Make sure there is a barrier between to skin and the ice pack.    Using frozen items such as frozen peas works well as the conform nicely to the are that needs to be iced.  USE AN ACE WRAP OR TED HOSE FOR SWELLING CONTROL  In addition to icing and elevation, Ace wraps or TED hose are used to help limit and resolve swelling.  It is recommended to use Ace wraps or TED hose until you are informed to stop.    When using Ace Wraps start the wrapping distally (farthest away from the body) and wrap proximally (closer to the body)   Example: If you had surgery on your leg or thing and you do not have a splint on, start the ace wrap at the toes and work your way up to the thigh        If you had surgery on your upper extremity and do not have a splint on, start the ace wrap at your fingers and work your way up to the upper arm  IF YOU ARE IN A SPLINT OR CAST DO NOT REMOVE IT FOR ANY REASON   If your splint gets wet for any reason please contact the office immediately. You may shower in your splint or cast as long as you keep it dry.  This can be done by wrapping in a cast cover or garbage back (or similar)  Do Not stick any thing down your splint or cast such as pencils, money, or hangers to try and scratch yourself  with.  If you feel itchy take benadryl as prescribed on the bottle for itching  IF YOU ARE IN A CAM BOOT (BLACK BOOT)  You may remove boot periodically. Perform daily dressing changes as noted below.  Wash the liner of the boot regularly and wear a sock when wearing the boot. It is recommended that you sleep in the boot until told otherwise  CALL THE OFFICE WITH ANY QUESTIONS OR CONCERTS: [phone]     Discharge Pin Site Instructions  Dress pins daily with Kerlix roll starting on POD 2. Wrap the Kerlix so that it tamps the skin down around the pin-skin interface to prevent/limit motion of the skin relative to the pin.  (Pin-skin motion is the primary cause of pain and infection related to external fixator pin sites).  Remove any crust or coagulum that may obstruct drainage with a saline moistened gauze or soap and water.  After POD 3, if there is no discernable drainage on the pin site dressing, the interval for change can by increased to every other day.  You may shower with the fixator, cleaning all pin sites gently with soap and water.  If you have a surgical wound this needs to be completely dry and without drainage before showering.  The extremity can be lifted by the fixator to facilitate wound care and transfers.  Notify the office/Doctor if you experience increasing drainage, redness, or pain from a pin site, or if you notice purulent (thick, snot-like) drainage.  Discharge Wound Care Instructions  Do NOT apply any ointments, solutions or lotions to pin sites or surgical wounds.  These prevent needed drainage and even though solutions like hydrogen peroxide kill bacteria, they also damage cells lining the pin sites that help fight infection.  Applying lotions or ointments can keep the wounds moist and can cause them to breakdown and open  up as well. This can increase the risk for infection. When in doubt call the office.  Surgical incisions should be dressed daily.  If any  drainage is noted, use one layer of adaptic, then gauze, Kerlix, and an ace wrap.  Once the incision is completely dry and without drainage, it may be left open to air out.  Showering may begin 36-48 hours later.  Cleaning gently with soap and water.  Traumatic wounds should be dressed daily as well.    One layer of adaptic, gauze, Kerlix, then ace wrap.  The adaptic can be discontinued once the draining has ceased    If you have a wet to dry dressing: wet the gauze with saline the squeeze as much saline out so the gauze is moist (not soaking wet), place moistened gauze over wound, then place a dry gauze over the moist one, followed by Kerlix wrap, then ace wrap.    Do not put a pillow under the knee. Place it under the heel.  As directed     Driving restrictions  As directed     Comments:      No driving    Increase activity slowly as tolerated  As directed     Non weight bearing  As directed         Medication List    STOP taking these medications       aspirin EC 81 MG tablet     cyclobenzaprine 10 MG tablet  Commonly known as:  FLEXERIL     oxyCODONE-acetaminophen 5-325 MG per tablet  Commonly known as:  PERCOCET/ROXICET      TAKE these medications       enoxaparin 40 MG/0.4ML injection  Commonly known as:  LOVENOX  Inject 0.4 mLs (40 mg total) into the skin daily.     methocarbamol 500 MG tablet  Commonly known as:  ROBAXIN  Take 1-2 tablets (500-1,000 mg total) by mouth 4 (four) times daily.     oxyCODONE 5 MG immediate release tablet  Commonly known as:  Oxy IR/ROXICODONE  Take 1-3 tablets (5-15 mg total) by mouth every 3 (three) hours as needed (breakthrough pain between percocet).     oxyCODONE-acetaminophen 10-325 MG per tablet  Commonly known as:  PERCOCET  Take 1-2 tablets by mouth every 6 (six) hours as needed for pain.     polyethylene glycol packet  Commonly known as:  MIRALAX / GLYCOLAX  Take 17 g by mouth daily.     sodium chloride 0.65 % nasal  spray  Commonly known as:  OCEAN  Place 1 spray into the nose as needed for congestion.     STOOL SOFTENER PO  Take 1 tablet by mouth daily.           Follow-up Information   Follow up with HANDY,MICHAEL H, MD. Schedule an appointment as soon as possible for a visit in 14 days. (call for appointment)    Contact information:   [address] [place] [address] [phone]       Discharge Instructions and Plan: Mr. Novacek has sustained a fairly severe injury to the Right knee. We were able to achieve excellent fixation with plate osteosynthesis. It was a technically successful procedure. We were able to restore alignment, assess for meniscal injury, address stability and restored joint surface congruity which are all favorable to a successful outcome. Patient is still at risk for the development of posttraumatic arthritis which has been discussed  at length and we will continue to monitor the patient for signs and symptoms of such. Patient will be nonweightbearing for the next 6-8 weeks. Unrestricted range of motion of the Right knee in the hinged knee brace. total knee precautions should be followed when at rest. He may remove the brace to facilitate ROM exercises We will refer Mr. Schwarz to outpatient physical therapy after the first postoperative visit. He will be on Lovenox for DVT/PE prophylaxis for 2 weeks Daily dressing changes should be performed as per discharge wound care instructions. Patient can resume prehospital diet Patient will be on oxycodone, Percocet, Robaxin for pain control We will see the patient back in the office in 10-14 days for reevaluation, followup x-rays and removal of sutures. Should the patient have any questions for the first postoperative visit and encouraged to contact the office immediately. Patient will be vigilant for any redness increased drainage and increased pain, which are suggestive of infection and  will contact the office immediately. Patient will also be vigilant for fevers or chills or any other concerning signs. They will contact the office if any of these develop.   Signed:  Mearl Latin, PA-C [facility] [phone] (P) 02/13/2013, 9:12 AM
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
## 2013-02-13 NOTE — Progress Notes (Signed)
Dilaudid PCA pump discontinued. Last reading of 1.2mg  noted.  Wasted 2.68mL of Dilaudid with Corrie Dandy, RN in sink then syringe when into the sharp container.
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
## 2013-02-14 LAB — CBC
HCT: 38.5 % — ABNORMAL LOW (ref 39.0–52.0)
Hemoglobin: 13.2 g/dL (ref 13.0–17.0)
WBC: 11 10*3/uL — ABNORMAL HIGH (ref 4.0–10.5)

## 2013-02-14 LAB — BASIC METABOLIC PANEL
Chloride: 97 mEq/L (ref 96–112)
GFR calc Af Amer: >90 mL/min (ref >90)
Potassium: 4.2 mEq/L (ref 3.5–5.1)
Sodium: 135 mEq/L (ref 135–145)

## 2013-02-14 MED ORDER — HYDROMORPHONE HCL 2 MG PO TABS
2.0000 mg | ORAL_TABLET | ORAL | Status: DC | PRN
  Administered 2013-02-14: 2 mg via ORAL
  Filled 2013-02-14: qty 1

## 2013-02-14 MED ORDER — DIAZEPAM 5 MG PO TABS: 5.0000 mg | ORAL_TABLET | Freq: Four times a day (QID) | ORAL | Status: DC | PRN

## 2013-02-14 NOTE — Progress Notes (Signed)
Patient ID: Charles Swanson, male   DOB: [birth year], 32 y.o.   MRN: [id number]     Subjective:  Patient reports pain as mild.  Patient states that he had some increased pain last night and thought that the percocet worked better than the Oxy IR.   Objective:   VITALS:   Filed Vitals:   02/13/13 1630 02/13/13 2100 02/14/13 0655 02/14/13 1428  BP:  127/75 139/86 136/81  Pulse:  109 95 108  Temp: 100.8 F (38.2 C) 99.5 F (37.5 C) 98.4 F (36.9 C) 99 F (37.2 C)  TempSrc:  Oral Oral Oral  Resp:  18 18 20   SpO2:  98% 98% 98%    ABD soft Sensation intact distally Dorsiflexion/Plantar flexion intact Incision: dressing C/D/I and no drainage Drain removed the wound is clean and dry no sign of infection Dry dressing applied   Lab Results  Component Value Date   WBC 11.0* 02/14/2013   HGB 13.2 02/14/2013   HCT 38.5* 02/14/2013   MCV 82.8 02/14/2013   PLT 335 02/14/2013     Assessment/Plan: 2 Days Post-Op   Principal Problem:   Tibial plateau fracture Active Problems:   HTN (hypertension)  Pain is not currently controlled, and we're going to try different formulation to get his pain under control over the course of the next 24 hours.  Advance diet Up with therapy Continue pain meds and plan for DC tomorrow   Mishal Probert P 02/14/2013, 3:04 PM   Teryl Lucy, MD Cell [phone]

## 2013-02-14 NOTE — Progress Notes (Signed)
I saw and examined the patient with Mr. Paul, communicating the findings and plan noted above.  [place]aclara Acklin, MD [facility] [phone] (p)  

## 2013-02-14 NOTE — Progress Notes (Signed)
Physical Therapy Treatment Patient Details Name: Charles Swanson MRN: [id number] DOB: 1980-11-25 Today's Date: 02/14/2013 Time: 1105-1130 PT Time Calculation (min): 25 min  PT Assessment / Plan / Recommendation Comments on Treatment Session  Pt making great progress toward goals with HEP education and stair education completed today.    Follow Up Recommendations  Home health PT;Supervision/Assistance - 24 hour;Supervision for mobility/OOB           Equipment Recommendations  Rolling walker with 5" wheels       Frequency Min 6X/week   Plan Discharge plan remains appropriate;Frequency remains appropriate    Precautions / Restrictions Precautions Precautions: Fall Required Braces or Orthoses: Knee Immobilizer - Right Knee Immobilizer - Right: On at all times Restrictions RLE Weight Bearing: Non weight bearing       Mobility  Bed Mobility Bed Mobility: Not assessed Transfers Sit to Stand: 5: Supervision;From chair/3-in-1;With upper extremity assist;With armrests Stand to Sit: 5: Supervision;To chair/3-in-1;With upper extremity assist;With armrests Details for Transfer Assistance: no cues needed, supervision for safetey only Ambulation/Gait Ambulation/Gait Assistance: 4: Min guard Ambulation Distance (Feet): 20 Feet Assistive device: Crutches Ambulation/Gait Assistance Details: from chair to/from stairs in gym Gait Pattern: Step-to pattern Stairs: Yes Stairs Assistance: 4: Min guard Stair Management Technique: Step to pattern;Forwards;With crutches Number of Stairs: 3    Exercises General Exercises - Lower Extremity Ankle Circles/Pumps: AROM;Both;10 reps;Seated Quad Sets: AROM;Strengthening;Right;10 reps;Seated Short Arc Quad: AROM;Strengthening;Right;5 reps;10 reps;Seated Heel Slides: AAROM;Strengthening;Right;10 reps;Seated Straight Leg Raises: AROM;Strengthening;Right;10 reps;Seated      PT Goals Acute Rehab PT Goals Pt will go Sit to Stand: with modified  independence PT Goal: Sit to Stand - Progress: Progressing toward goal Pt will go Stand to Sit: with modified independence PT Goal: Stand to Sit - Progress: Progressing toward goal Pt will Transfer Bed to Chair/Chair to Bed: with modified independence PT Transfer Goal: Bed to Chair/Chair to Bed - Progress: Progressing toward goal Pt will Ambulate: 51 - 150 feet;with modified independence;with least restrictive assistive device PT Goal: Ambulate - Progress: Progressing toward goal Pt will Go Up / Down Stairs: 1-2 stairs;with min assist;with least restrictive assistive device PT Goal: Up/Down Stairs - Progress: Met  Visit Information  Last PT Received On: 02/14/13 Assistance Needed: +1    Subjective Data  Subjective: No new complaints, agreeable to therapy at this time.   Cognition  Cognition Arousal/Alertness: Awake/alert Behavior During Therapy: WFL for tasks assessed/performed Overall Cognitive Status: Within Functional Limits for tasks assessed       End of Session PT - End of Session Equipment Utilized During Treatment: Gait belt;Right knee immobilizer Activity Tolerance: Patient tolerated treatment well Patient left: in chair;with call bell/phone within reach;Other (comment) Nurse Communication: Mobility status;Weight bearing status   GP     Sallyanne Kuster 02/14/2013, 12:32 PM  Sallyanne Kuster, PTA Office- [phone]

## 2013-02-15 LAB — BASIC METABOLIC PANEL
BUN: 13 mg/dL (ref 6–23)
Chloride: 98 mEq/L (ref 96–112)
GFR calc Af Amer: >90 mL/min (ref >90)
Glucose, Bld: 105 mg/dL — ABNORMAL HIGH (ref 70–99)
Potassium: 4.5 mEq/L (ref 3.5–5.1)
Sodium: 135 mEq/L (ref 135–145)

## 2013-02-15 NOTE — Progress Notes (Signed)
Discharge instructions given. Prescriptions given. Instructions on how to administer Lovenox was given. Patient had no questions.

## 2013-02-15 NOTE — Progress Notes (Signed)
Patient ID: Charles Swanson, male   DOB: [birth year], 32 y.o.   MRN: [id number]     Subjective:  Patient reports pain as mild to moderate.  He states tha he is better today and was able to sleep last night.  Objective:   VITALS:   Filed Vitals:   02/14/13 0655 02/14/13 1428 02/14/13 2054 02/15/13 0612  BP: 139/86 136/81 152/93 127/82  Pulse: 95 108 110 89  Temp: 98.4 F (36.9 C) 99 F (37.2 C) 98.3 F (36.8 C) 98.6 F (37 C)  TempSrc: Oral Oral Oral Oral  Resp: 18 20 18 18   SpO2: 98% 98% 98% 96%    ABD soft Sensation intact distally Dorsiflexion/Plantar flexion intact Incision: dressing C/D/I and no drainage   Lab Results  Component Value Date   WBC 11.0* 02/14/2013   HGB 13.2 02/14/2013   HCT 38.5* 02/14/2013   MCV 82.8 02/14/2013   PLT 335 02/14/2013     Assessment/Plan: 3 Days Post-Op   Principal Problem:   Tibial plateau fracture Active Problems:   HTN (hypertension)   Advance diet Up with therapy Discharge home with home health Meds in chart per Dr Carola Frost and DC per his orders   Charles Swanson, Charles Swanson 02/15/2013, 9:31 AM   Charles Lucy, MD Cell [phone]

## 2013-03-02 ENCOUNTER — Other Ambulatory Visit: Payer: Self-pay | Admitting: Orthopedic Surgery

## 2013-03-02 DIAGNOSIS — H538 Other visual disturbances: Secondary | ICD-10-CM

## 2013-03-05 ENCOUNTER — Ambulatory Visit
Admission: RE | Admit: 2013-03-05 | Discharge: 2013-03-05 | Disposition: A | Discharge status: Home / Self Care | Payer: BC Managed Care – PPO | Source: Ambulatory Visit | Attending: Orthopedic Surgery | Admitting: Orthopedic Surgery

## 2013-03-05 DIAGNOSIS — H538 Other visual disturbances: Secondary | ICD-10-CM

## 2013-03-05 MED ORDER — GADOBENATE DIMEGLUMINE 529 MG/ML IV SOLN
20.0000 mL | Freq: Once | INTRAVENOUS | Status: AC | PRN
  Administered 2013-03-05: 20 mL via INTRAVENOUS

## 2013-03-07 ENCOUNTER — Other Ambulatory Visit: Payer: BC Managed Care – PPO

## 2013-07-11 DIAGNOSIS — S83289A Other tear of lateral meniscus, current injury, unspecified knee, initial encounter: Secondary | ICD-10-CM

## 2013-07-11 DIAGNOSIS — M94261 Chondromalacia, right knee: Secondary | ICD-10-CM

## 2013-07-11 HISTORY — DX: Other tear of lateral meniscus, current injury, unspecified knee, initial encounter: S83.289A

## 2013-07-11 HISTORY — DX: Chondromalacia, right knee: M94.261

## 2013-07-13 ENCOUNTER — Other Ambulatory Visit: Payer: Self-pay | Admitting: Orthopedic Surgery

## 2013-07-29 ENCOUNTER — Encounter (HOSPITAL_BASED_OUTPATIENT_CLINIC_OR_DEPARTMENT_OTHER): Payer: Self-pay | Admitting: *Deleted

## 2013-07-30 ENCOUNTER — Encounter (HOSPITAL_BASED_OUTPATIENT_CLINIC_OR_DEPARTMENT_OTHER): Payer: Self-pay | Admitting: *Deleted
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
## 2013-08-03 ENCOUNTER — Encounter (HOSPITAL_BASED_OUTPATIENT_CLINIC_OR_DEPARTMENT_OTHER): Payer: BC Managed Care – PPO | Admitting: Anesthesiology

## 2013-08-03 ENCOUNTER — Encounter (HOSPITAL_BASED_OUTPATIENT_CLINIC_OR_DEPARTMENT_OTHER)
Admission: RE | Disposition: A | Discharge status: Home / Self Care | Payer: Self-pay | Source: Ambulatory Visit | Attending: Orthopedic Surgery

## 2013-08-03 ENCOUNTER — Encounter (HOSPITAL_BASED_OUTPATIENT_CLINIC_OR_DEPARTMENT_OTHER): Payer: Self-pay | Admitting: Anesthesiology

## 2013-08-03 ENCOUNTER — Ambulatory Visit (HOSPITAL_BASED_OUTPATIENT_CLINIC_OR_DEPARTMENT_OTHER)
Admission: RE | Admit: 2013-08-03 | Discharge: 2013-08-03 | Disposition: A | Discharge status: Home / Self Care | Payer: BC Managed Care – PPO | Source: Ambulatory Visit | Attending: Orthopedic Surgery | Admitting: Orthopedic Surgery

## 2013-08-03 ENCOUNTER — Ambulatory Visit (HOSPITAL_BASED_OUTPATIENT_CLINIC_OR_DEPARTMENT_OTHER): Payer: BC Managed Care – PPO | Admitting: Anesthesiology

## 2013-08-03 DIAGNOSIS — S83289A Other tear of lateral meniscus, current injury, unspecified knee, initial encounter: Secondary | ICD-10-CM | POA: Insufficient documentation

## 2013-08-03 DIAGNOSIS — M2240 Chondromalacia patellae, unspecified knee: Secondary | ICD-10-CM | POA: Insufficient documentation

## 2013-08-03 DIAGNOSIS — Z87442 Personal history of urinary calculi: Secondary | ICD-10-CM | POA: Insufficient documentation

## 2013-08-03 DIAGNOSIS — S8290XS Unspecified fracture of unspecified lower leg, sequela: Secondary | ICD-10-CM | POA: Insufficient documentation

## 2013-08-03 DIAGNOSIS — M12569 Traumatic arthropathy, unspecified knee: Secondary | ICD-10-CM | POA: Insufficient documentation

## 2013-08-03 DIAGNOSIS — I10 Essential (primary) hypertension: Secondary | ICD-10-CM | POA: Insufficient documentation

## 2013-08-03 DIAGNOSIS — X58XXXS Exposure to other specified factors, sequela: Secondary | ICD-10-CM | POA: Insufficient documentation

## 2013-08-03 DIAGNOSIS — S83281A Other tear of lateral meniscus, current injury, right knee, initial encounter: Secondary | ICD-10-CM

## 2013-08-03 DIAGNOSIS — K5730 Diverticulosis of large intestine without perforation or abscess without bleeding: Secondary | ICD-10-CM | POA: Insufficient documentation

## 2013-08-03 HISTORY — PX: KNEE ARTHROSCOPY WITH LATERAL MENISECTOMY: SHX6193

## 2013-08-03 HISTORY — DX: Other tear of lateral meniscus, current injury, unspecified knee, initial encounter: S83.289A

## 2013-08-03 HISTORY — DX: Personal history of pneumonia (recurrent): Z87.01

## 2013-08-03 HISTORY — DX: Personal history of other diseases of the respiratory system: Z87.09

## 2013-08-03 HISTORY — PX: CHONDROPLASTY: SHX5177

## 2013-08-03 HISTORY — DX: Chondromalacia, right knee: M94.261

## 2013-08-03 LAB — POCT I-STAT, CHEM 8
Chloride: 99 mEq/L (ref 96–112)
Glucose, Bld: 92 mg/dL (ref 70–99)
HCT: 46 % (ref 39.0–52.0)
Hemoglobin: 15.6 g/dL (ref 13.0–17.0)
Potassium: 4.4 mEq/L (ref 3.5–5.1)
Sodium: 140 mEq/L (ref 135–145)
TCO2: 25 mmol/L (ref 0–100)

## 2013-08-03 SURGERY — ARTHROSCOPY, KNEE, WITH LATERAL MENISCECTOMY
Anesthesia: General | Site: Knee | Laterality: Right | Wound class: Clean

## 2013-08-03 MED ORDER — SODIUM CHLORIDE 0.9 % IR SOLN
Status: DC | PRN
  Administered 2013-08-03: 3000 mL

## 2013-08-03 MED ORDER — CEFAZOLIN SODIUM 1-5 GM-% IV SOLN
INTRAVENOUS | Status: AC
  Filled 2013-08-03: qty 100

## 2013-08-03 MED ORDER — HYDROCODONE-ACETAMINOPHEN 5-325 MG PO TABS: 1.0000 | ORAL_TABLET | Freq: Four times a day (QID) | ORAL | Status: DC | PRN

## 2013-08-03 MED ORDER — LACTATED RINGERS IV SOLN
INTRAVENOUS | Status: DC
  Administered 2013-08-03: 11:00:00 via INTRAVENOUS
  Administered 2013-08-03: 20 mL/h via INTRAVENOUS

## 2013-08-03 MED ORDER — OXYCODONE HCL 5 MG PO TABS: 5.0000 mg | ORAL_TABLET | Freq: Once | ORAL | Status: DC | PRN

## 2013-08-03 MED ORDER — BUPIVACAINE-EPINEPHRINE PF 0.5-1:200000 % IJ SOLN
INTRAMUSCULAR | Status: AC
  Filled 2013-08-03: qty 30

## 2013-08-03 MED ORDER — FENTANYL CITRATE 0.05 MG/ML IJ SOLN
INTRAMUSCULAR | Status: AC
  Filled 2013-08-03: qty 6

## 2013-08-03 MED ORDER — FENTANYL CITRATE 0.05 MG/ML IJ SOLN: 50.0000 ug | INTRAMUSCULAR | Status: DC | PRN

## 2013-08-03 MED ORDER — MIDAZOLAM HCL 5 MG/5ML IJ SOLN
INTRAMUSCULAR | Status: DC | PRN
  Administered 2013-08-03: 2 mg via INTRAVENOUS

## 2013-08-03 MED ORDER — HYDROMORPHONE HCL PF 1 MG/ML IJ SOLN: 0.2500 mg | INTRAMUSCULAR | Status: DC | PRN

## 2013-08-03 MED ORDER — PROPOFOL 10 MG/ML IV BOLUS
INTRAVENOUS | Status: DC | PRN
  Administered 2013-08-03: 200 mg via INTRAVENOUS

## 2013-08-03 MED ORDER — EPINEPHRINE HCL 1 MG/ML IJ SOLN
INTRAMUSCULAR | Status: AC
  Filled 2013-08-03: qty 1

## 2013-08-03 MED ORDER — ONDANSETRON HCL 4 MG/2ML IJ SOLN
INTRAMUSCULAR | Status: DC | PRN
  Administered 2013-08-03: 4 mg via INTRAVENOUS

## 2013-08-03 MED ORDER — BUPIVACAINE-EPINEPHRINE 0.5% -1:200000 IJ SOLN
INTRAMUSCULAR | Status: DC | PRN
  Administered 2013-08-03: 20 mL

## 2013-08-03 MED ORDER — OXYCODONE HCL 5 MG/5ML PO SOLN: 5.0000 mg | Freq: Once | ORAL | Status: DC | PRN

## 2013-08-03 MED ORDER — EPINEPHRINE HCL 1 MG/ML IJ SOLN
INTRAMUSCULAR | Status: DC | PRN
  Administered 2013-08-03: 1 mg

## 2013-08-03 MED ORDER — FENTANYL CITRATE 0.05 MG/ML IJ SOLN
INTRAMUSCULAR | Status: DC | PRN
  Administered 2013-08-03: 100 ug via INTRAVENOUS
  Administered 2013-08-03: 50 ug via INTRAVENOUS

## 2013-08-03 MED ORDER — CEFAZOLIN SODIUM-DEXTROSE 2-3 GM-% IV SOLR
2.0000 g | INTRAVENOUS | Status: AC
  Administered 2013-08-03: 2 g via INTRAVENOUS

## 2013-08-03 MED ORDER — LIDOCAINE HCL (CARDIAC) 20 MG/ML IV SOLN
INTRAVENOUS | Status: DC | PRN
  Administered 2013-08-03: 60 mg via INTRAVENOUS

## 2013-08-03 MED ORDER — MIDAZOLAM HCL 2 MG/2ML IJ SOLN: 1.0000 mg | INTRAMUSCULAR | Status: DC | PRN

## 2013-08-03 MED ORDER — BUPIVACAINE HCL (PF) 0.5 % IJ SOLN
INTRAMUSCULAR | Status: AC
  Filled 2013-08-03: qty 30

## 2013-08-03 MED ORDER — CHLORHEXIDINE GLUCONATE 4 % EX LIQD: 60.0000 mL | Freq: Once | CUTANEOUS | Status: DC

## 2013-08-03 MED ORDER — MIDAZOLAM HCL 2 MG/2ML IJ SOLN
INTRAMUSCULAR | Status: AC
  Filled 2013-08-03: qty 2

## 2013-08-03 SURGICAL SUPPLY — 37 items
BANDAGE ELASTIC 6 VELCRO ST LF (GAUZE/BANDAGES/DRESSINGS) ×2 IMPLANT
BLADE 4.2CUDA (BLADE) IMPLANT
BLADE CUTTER GATOR 3.5 (BLADE) IMPLANT
BLADE GREAT WHITE 4.2 (BLADE) IMPLANT
CANISTER SUCT 3000ML (MISCELLANEOUS) IMPLANT
CANISTER SUCT LVC 12 LTR MEDI- (MISCELLANEOUS) IMPLANT
CHLORAPREP W/TINT 26ML (MISCELLANEOUS) ×2 IMPLANT
DRAPE ARTHROSCOPY W/POUCH 114 (DRAPES) ×2 IMPLANT
ELECT MENISCUS 165MM 90D (ELECTRODE) IMPLANT
ELECT REM PT RETURN 9FT ADLT (ELECTROSURGICAL)
ELECTRODE REM PT RTRN 9FT ADLT (ELECTROSURGICAL) IMPLANT
GAUZE XEROFORM 1X8 LF (GAUZE/BANDAGES/DRESSINGS) ×2 IMPLANT
GLOVE BIO SURGEON STRL SZ7.5 (GLOVE) ×2 IMPLANT
GLOVE BIO SURGEON STRL SZ8.5 (GLOVE) ×2 IMPLANT
GLOVE BIOGEL PI IND STRL 8 (GLOVE) ×1 IMPLANT
GLOVE BIOGEL PI IND STRL 9 (GLOVE) ×1 IMPLANT
GLOVE BIOGEL PI INDICATOR 8 (GLOVE) ×1
GLOVE BIOGEL PI INDICATOR 9 (GLOVE) ×1
GOWN PREVENTION PLUS XLARGE (GOWN DISPOSABLE) ×2 IMPLANT
GOWN PREVENTION PLUS XXLARGE (GOWN DISPOSABLE) ×2 IMPLANT
IV NS IRRIG 3000ML ARTHROMATIC (IV SOLUTION) ×2 IMPLANT
KNEE WRAP E Z 3 GEL PACK (MISCELLANEOUS) ×2 IMPLANT
NDL SAFETY ECLIPSE 18X1.5 (NEEDLE) ×1 IMPLANT
NEEDLE HYPO 18GX1.5 SHARP (NEEDLE) ×1
PACK ARTHROSCOPY DSU (CUSTOM PROCEDURE TRAY) ×2 IMPLANT
PACK BASIN DAY SURGERY FS (CUSTOM PROCEDURE TRAY) ×2 IMPLANT
PAD ALCOHOL SWAB (MISCELLANEOUS) ×2 IMPLANT
PENCIL BUTTON HOLSTER BLD 10FT (ELECTRODE) IMPLANT
SET ARTHROSCOPY TUBING (MISCELLANEOUS) ×1
SET ARTHROSCOPY TUBING LN (MISCELLANEOUS) ×1 IMPLANT
SLEEVE SCD COMPRESS KNEE MED (MISCELLANEOUS) IMPLANT
SPONGE GAUZE 4X4 12PLY (GAUZE/BANDAGES/DRESSINGS) ×2 IMPLANT
SYR 3ML 18GX1 1/2 (SYRINGE) IMPLANT
SYR 5ML LL (SYRINGE) ×2 IMPLANT
TOWEL OR 17X24 6PK STRL BLUE (TOWEL DISPOSABLE) ×2 IMPLANT
WAND STAR VAC 90 (SURGICAL WAND) IMPLANT
WATER STERILE IRR 1000ML POUR (IV SOLUTION) ×2 IMPLANT
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
## 2013-08-03 NOTE — Anesthesia Procedure Notes (Signed)
Procedure Name: LMA Insertion Date/Time: 08/03/2013 12:51 PM Performed by: Burna Cash Pre-anesthesia Checklist: Patient identified, Emergency Drugs available, Suction available and Patient being monitored Patient Re-evaluated:Patient Re-evaluated prior to inductionOxygen Delivery Method: Circle System Utilized Preoxygenation: Pre-oxygenation with 100% oxygen Intubation Type: IV induction Ventilation: Mask ventilation without difficulty LMA: LMA inserted LMA Size: 5.0 Number of attempts: 1 Airway Equipment and Method: bite block Placement Confirmation: positive ETCO2 Tube secured with: Tape Dental Injury: Teeth and Oropharynx as per pre-operative assessment

## 2013-08-03 NOTE — Transfer of Care (Signed)
Immediate Anesthesia Transfer of Care Note  Patient: Charles Swanson  Procedure(s) Performed: Procedure(s): KNEE ARTHROSCOPY WITH LATERAL MENISECTOMY (Right) CHONDROPLASTY (Right)  Patient Location: PACU  Anesthesia Type:General  Level of Consciousness: awake, alert  and oriented  Airway & Oxygen Therapy: Patient Spontanous Breathing and Patient connected to face mask oxygen  Post-op Assessment: Report given to PACU RN and Post -op Vital signs reviewed and stable  Post vital signs: Reviewed and stable  Complications: No apparent anesthesia complications

## 2013-08-03 NOTE — Anesthesia Preprocedure Evaluation (Addendum)
Anesthesia Evaluation  Patient identified by MRN, date of birth, ID band Patient awake    Reviewed: Allergy & Precautions, H&P , NPO status , Patient's Chart, lab work & pertinent test results  Airway Mallampati: I TM Distance: >3 FB Neck ROM: Full    Dental no notable dental hx. (+) Teeth Intact and Dental Advisory Given   Pulmonary neg pulmonary ROS,  breath sounds clear to auscultation  Pulmonary exam normal       Cardiovascular hypertension, On Medications Rhythm:Regular Rate:Normal     Neuro/Psych Seizures -, Well Controlled,  negative psych ROS   GI/Hepatic negative GI ROS, Neg liver ROS,   Endo/Other  negative endocrine ROS  Renal/GU negative Renal ROS  negative genitourinary   Musculoskeletal   Abdominal   Peds  Hematology negative hematology ROS (+)   Anesthesia Other Findings   Reproductive/Obstetrics negative OB ROS                          Anesthesia Physical Anesthesia Plan  ASA: II  Anesthesia Plan: General   Post-op Pain Management:    Induction: Intravenous  Airway Management Planned: LMA  Additional Equipment:   Intra-op Plan:   Post-operative Plan: Extubation in OR  Informed Consent: I have reviewed the patients History and Physical, chart, labs and discussed the procedure including the risks, benefits and alternatives for the proposed anesthesia with the patient or authorized representative who has indicated his/her understanding and acceptance.   Dental advisory given  Plan Discussed with: CRNA  Anesthesia Plan Comments:         Anesthesia Quick Evaluation

## 2013-08-03 NOTE — Interval H&P Note (Signed)
History and Physical Interval Note:  08/03/2013 12:10 PM  [name]  has presented today for surgery, with the diagnosis of RIGHT KNEE LATERAL MENISCUS TEAR/CHONDROMALACIA  The various methods of treatment have been discussed with the patient and family. After consideration of risks, benefits and other options for treatment, the patient has consented to  Procedure(s): RIGHT ARTHROSCOPY KNEE (Right) as a surgical intervention .  The patient's history has been reviewed, patient examined, no change in status, stable for surgery.  I have reviewed the patient's chart and labs.  Questions were answered to the patient's satisfaction.     Charles Swanson

## 2013-08-03 NOTE — Anesthesia Postprocedure Evaluation (Signed)
  Anesthesia Post-op Note  Patient: Charles Swanson  Procedure(s) Performed: Procedure(s): KNEE ARTHROSCOPY WITH LATERAL MENISECTOMY (Right) CHONDROPLASTY (Right)  Patient Location: PACU  Anesthesia Type:General  Level of Consciousness: awake and alert   Airway and Oxygen Therapy: Patient Spontanous Breathing  Post-op Pain: none  Post-op Assessment: Post-op Vital signs reviewed, Patient's Cardiovascular Status Stable and Respiratory Function Stable  Post-op Vital Signs: Reviewed  Filed Vitals:   08/03/13 1345  BP: 152/93  Pulse: 93  Temp:   Resp: 17    Complications: No apparent anesthesia complications

## 2013-08-04 ENCOUNTER — Encounter (HOSPITAL_BASED_OUTPATIENT_CLINIC_OR_DEPARTMENT_OTHER): Payer: Self-pay | Admitting: Orthopedic Surgery

## 2014-08-03 IMAGING — RF DG KNEE COMPLETE 4+V*R*
1 series · 6 of 6 positions shown · non-contrast
Comparison: None.

CLINICAL DATA: ORIF tibial plateau fracture

RIGHT KNEE - COMPLETE 4+ VIEW

[Series 1: run · 6 of 6 slices shown]
[im 1/6]
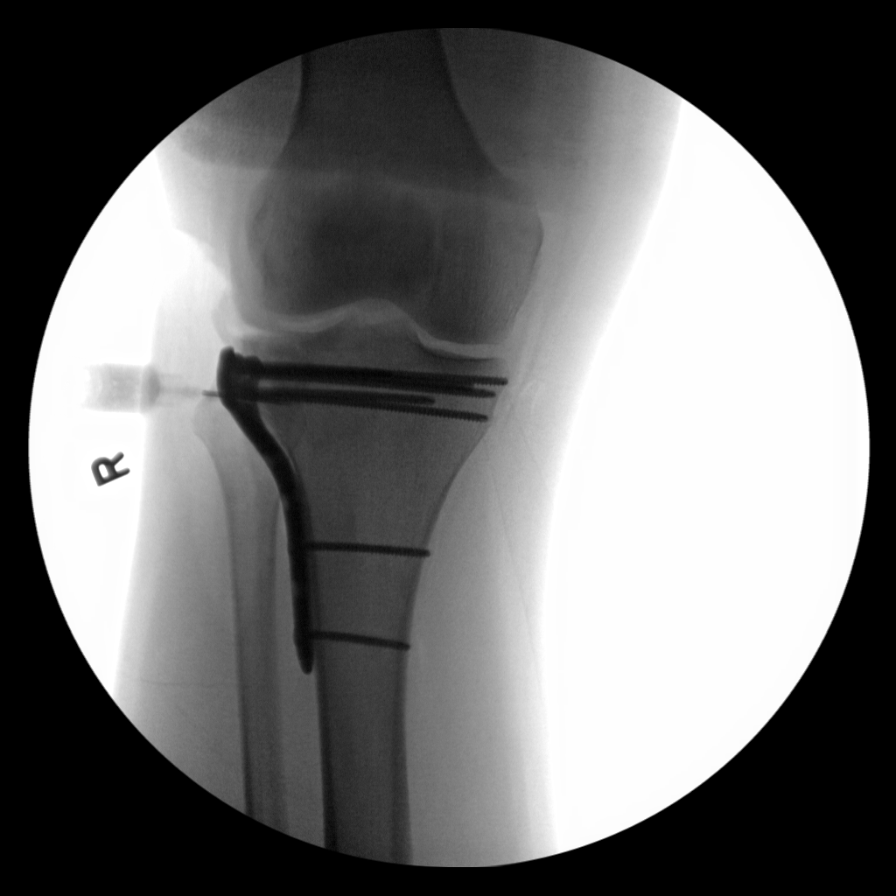
[im 2/6]
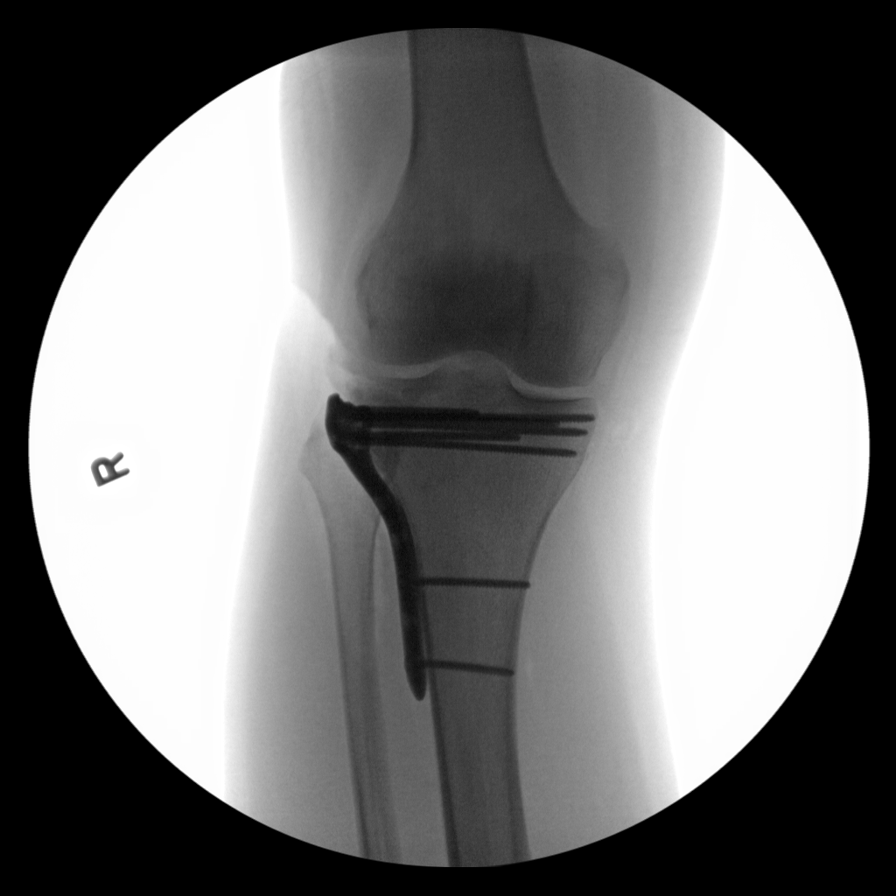
[im 3/6]
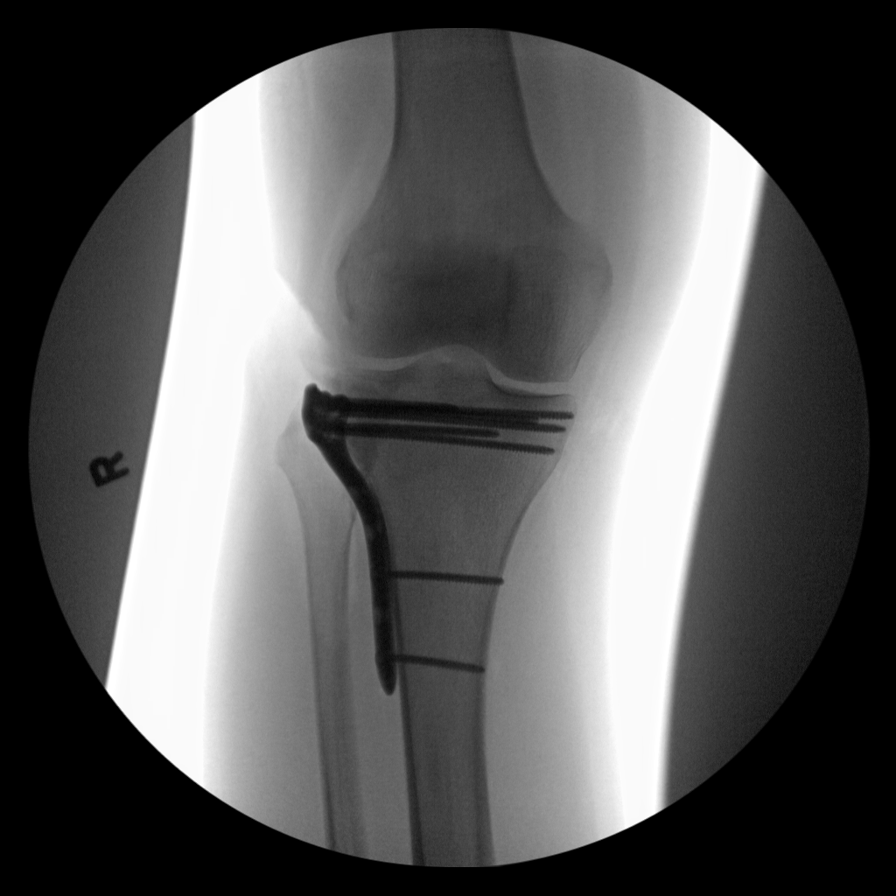
[im 4/6]
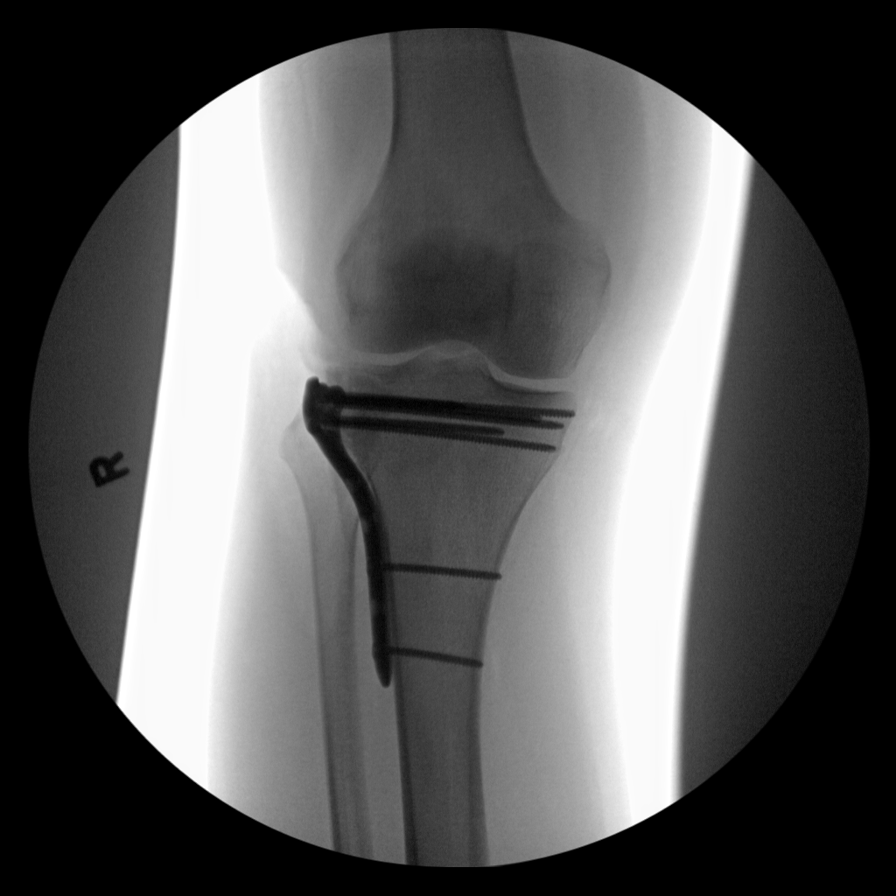
[im 5/6]
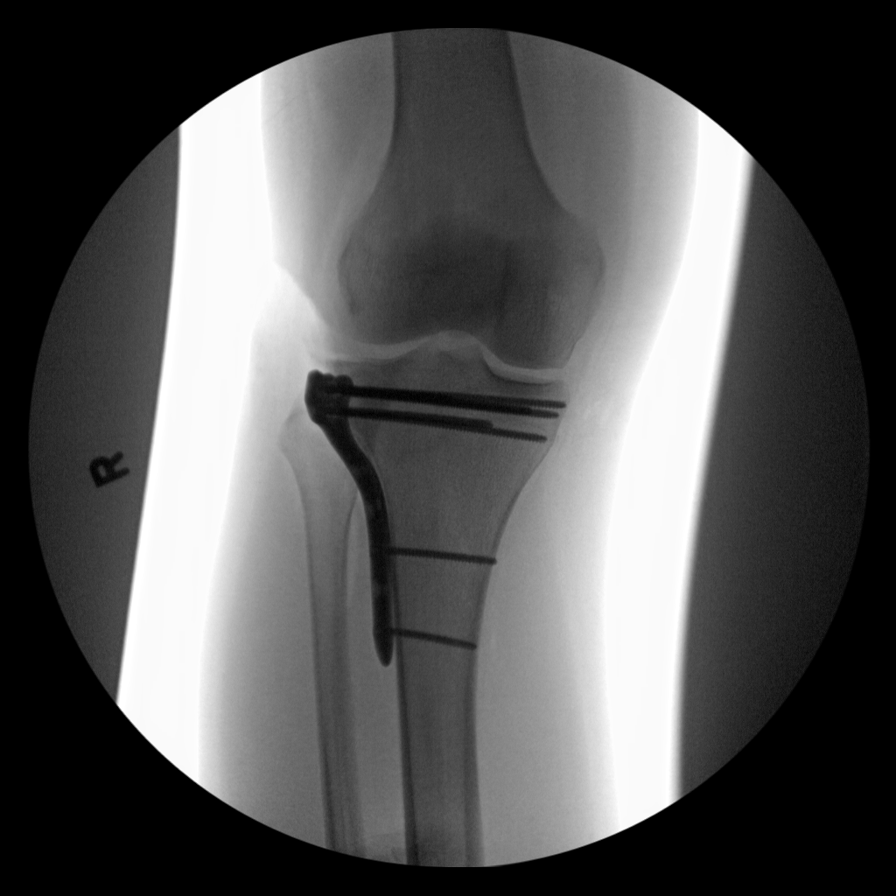
[im 6/6]
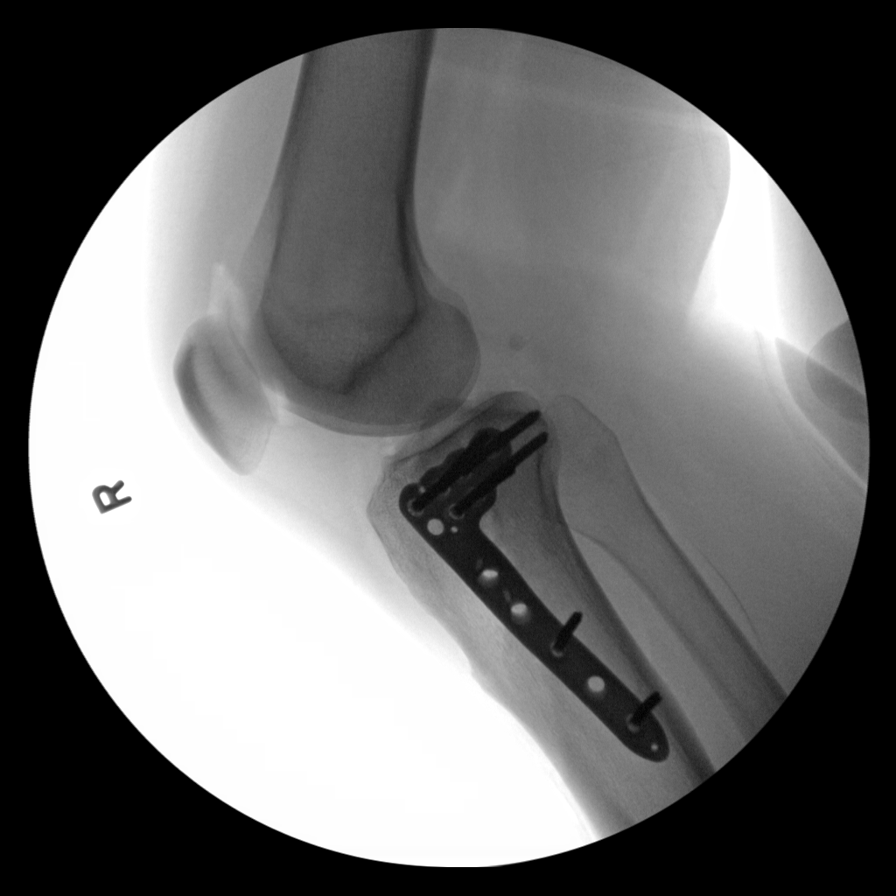

[6 of 6 positions shown; findings below may reference images not displayed]

FINDINGS: A total of six intraoperative spot views of the right
knee demonstrate ORIF of a tibial plateau fracture with a lateral
buttress plate and screw construct without hardware complication.
IMPRESSION: ORIF as above without immediate complication.

## 2014-08-03 IMAGING — CR DG KNEE 1-2V PORT*R*
2 series · 2 of 2 positions shown · non-contrast
Comparison: None.

CLINICAL DATA: Tibial plateau fracture fixation.

PORTABLE RIGHT KNEE - 1-2 VIEW

[AP]
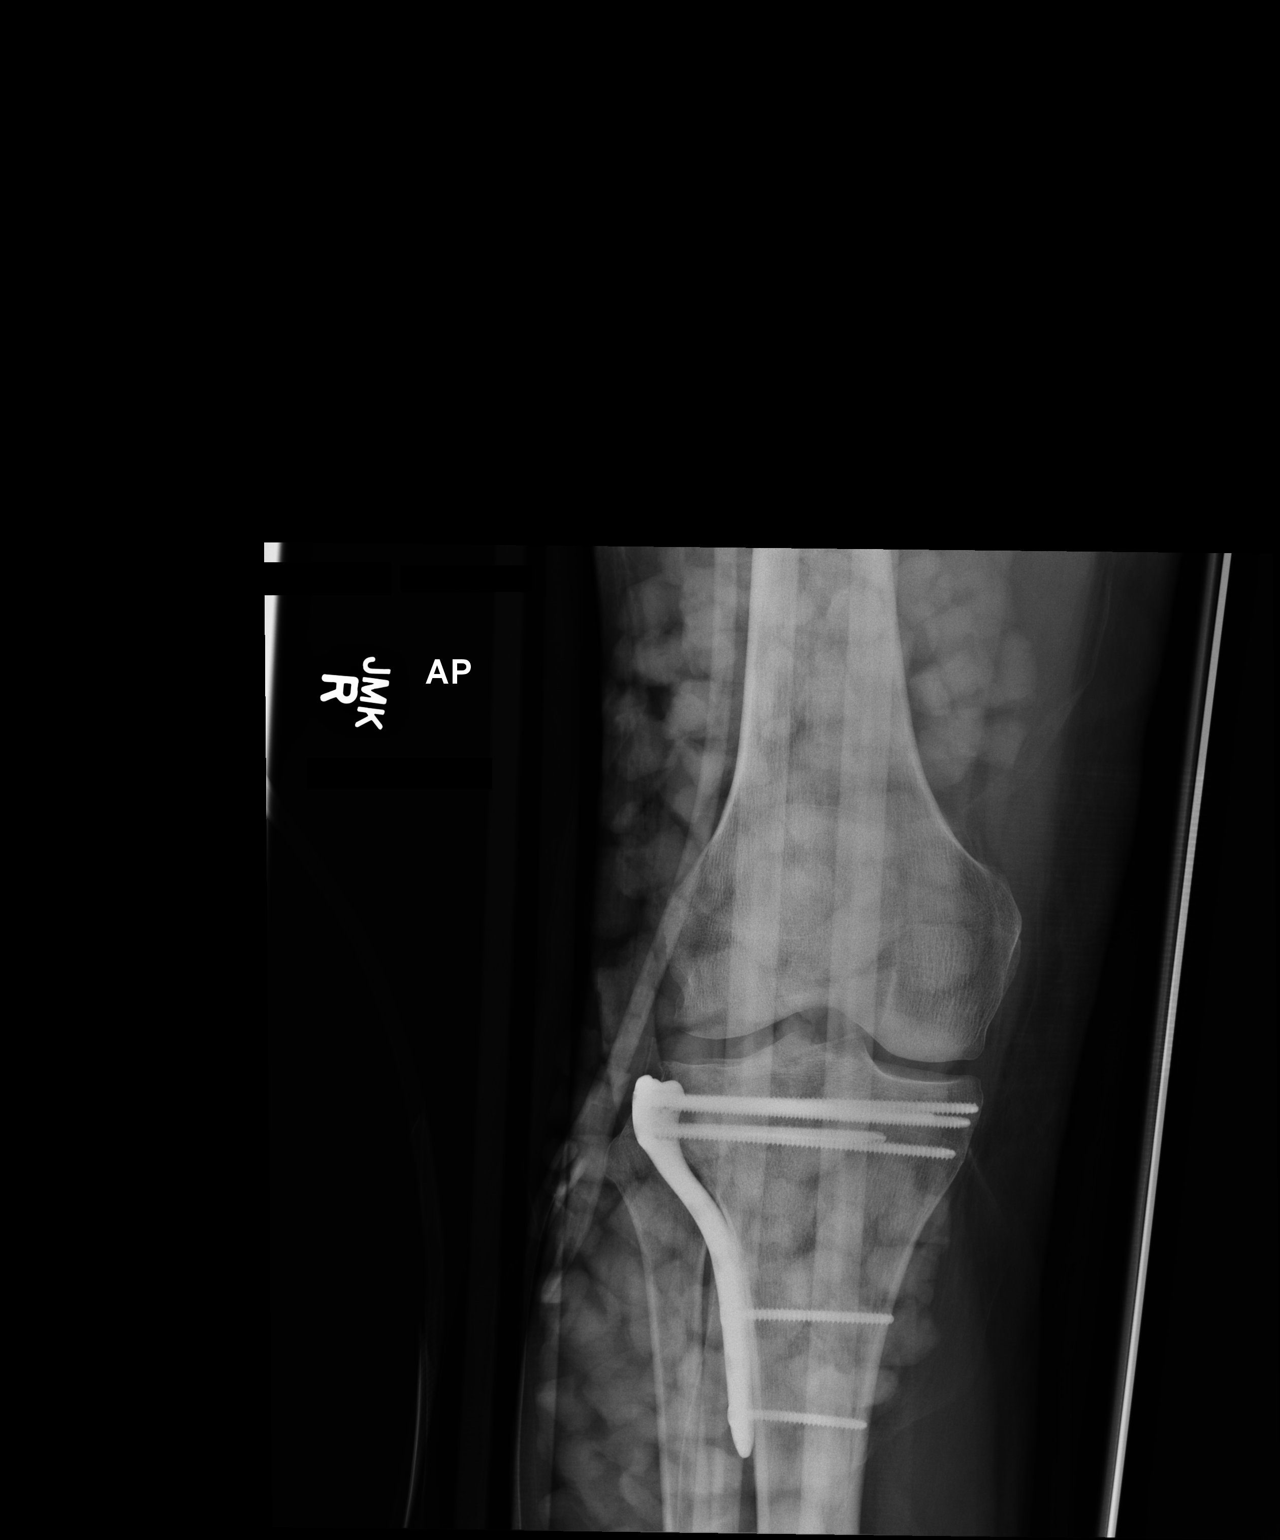

[xtable lateral]
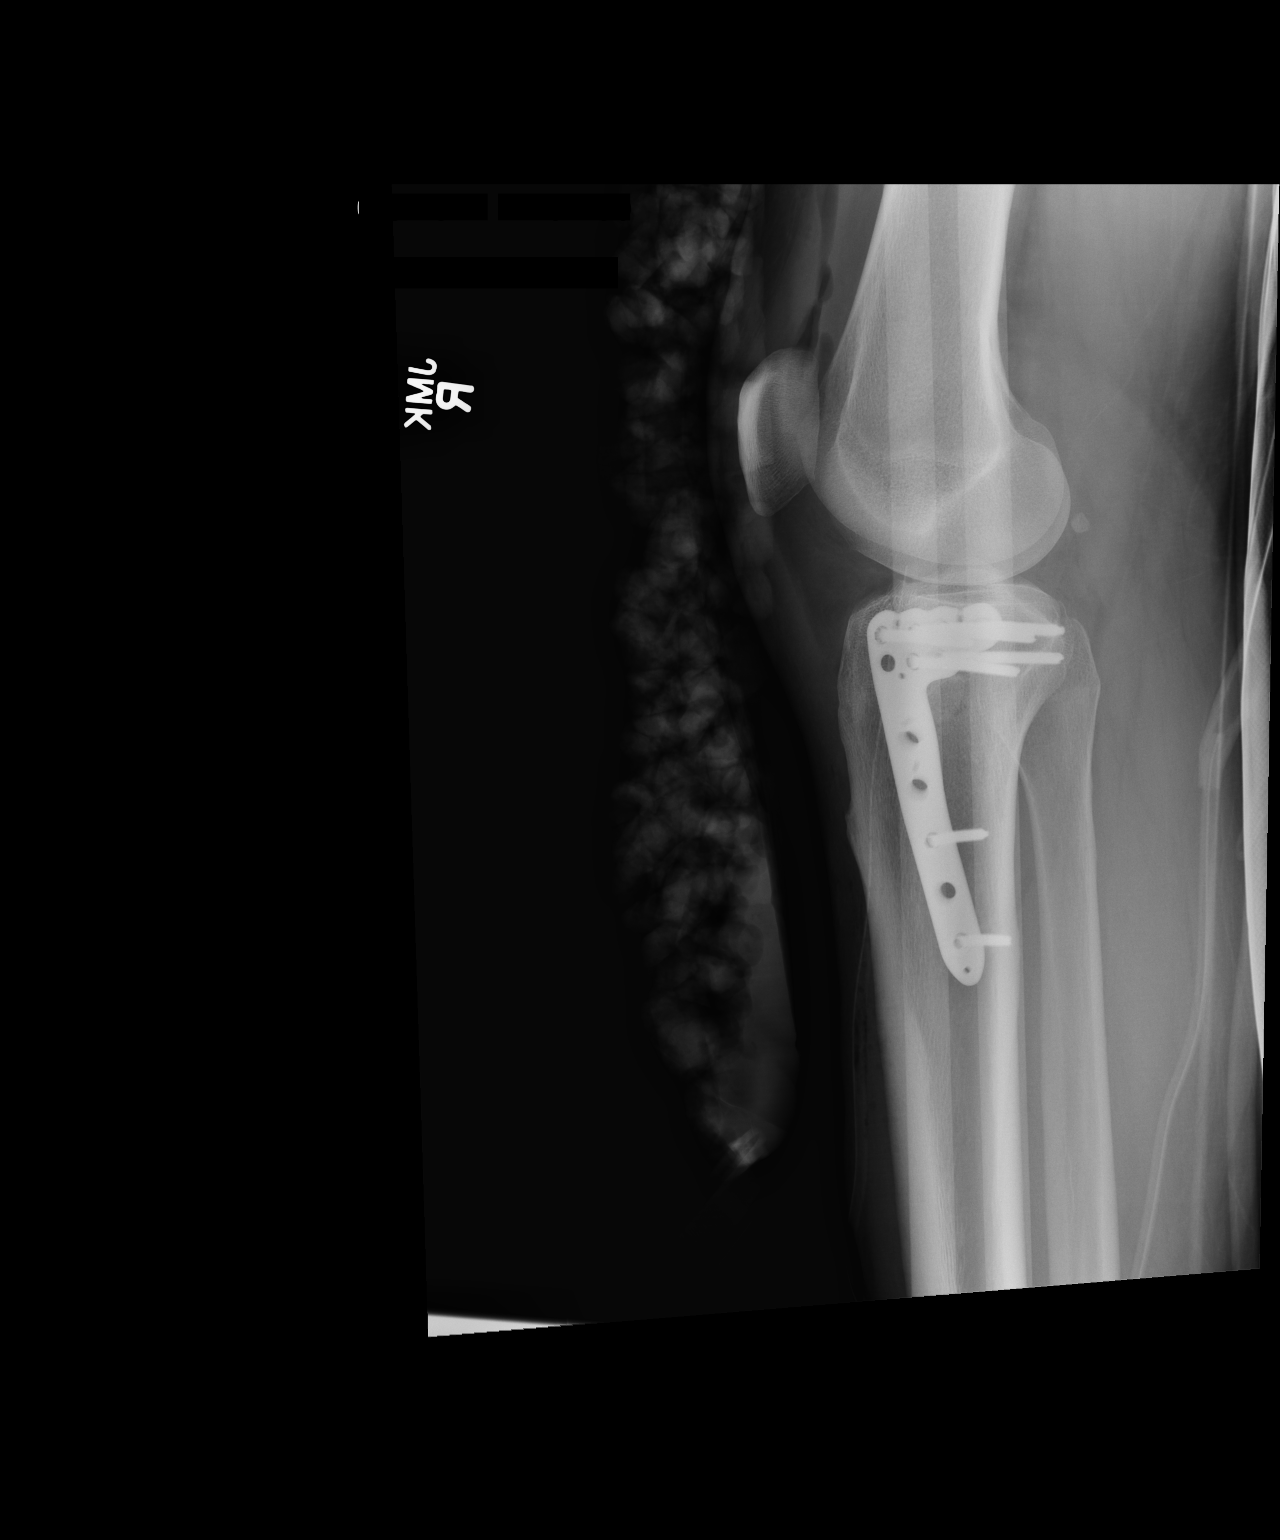

[2 of 2 positions shown; findings below may reference images not displayed]

FINDINGS: Lateral side plate and multiple screws transfixing the
tibial plateau fracture.  Good position and alignment without
complicating features.
IMPRESSION: Internal fixation of tibial plateau fracture with anatomic
reduction.

## 2017-03-08 ENCOUNTER — Emergency Department (HOSPITAL_BASED_OUTPATIENT_CLINIC_OR_DEPARTMENT_OTHER): Payer: Managed Care, Other (non HMO)

## 2017-03-08 ENCOUNTER — Encounter (HOSPITAL_BASED_OUTPATIENT_CLINIC_OR_DEPARTMENT_OTHER): Payer: Self-pay | Admitting: *Deleted

## 2017-03-08 ENCOUNTER — Emergency Department (HOSPITAL_BASED_OUTPATIENT_CLINIC_OR_DEPARTMENT_OTHER)
Admission: EM | Admit: 2017-03-08 | Discharge: 2017-03-09 | Disposition: A | Discharge status: Home / Self Care | Payer: Managed Care, Other (non HMO) | Attending: Emergency Medicine | Admitting: Emergency Medicine

## 2017-03-08 DIAGNOSIS — I10 Essential (primary) hypertension: Secondary | ICD-10-CM | POA: Diagnosis not present

## 2017-03-08 DIAGNOSIS — Z79899 Other long term (current) drug therapy: Secondary | ICD-10-CM | POA: Diagnosis not present

## 2017-03-08 DIAGNOSIS — K5732 Diverticulitis of large intestine without perforation or abscess without bleeding: Secondary | ICD-10-CM

## 2017-03-08 DIAGNOSIS — J45909 Unspecified asthma, uncomplicated: Secondary | ICD-10-CM | POA: Diagnosis not present

## 2017-03-08 DIAGNOSIS — R1030 Lower abdominal pain, unspecified: Secondary | ICD-10-CM | POA: Diagnosis present

## 2017-03-08 LAB — CBC WITH DIFFERENTIAL/PLATELET
BASOS ABS: 0 10*3/uL (ref 0.0–0.1)
BASOS PCT: 0 %
EOS ABS: 0.5 10*3/uL (ref 0.0–0.7)
EOS PCT: 4 %
HCT: 38.9 % — ABNORMAL LOW (ref 39.0–52.0)
Hemoglobin: 13.8 g/dL (ref 13.0–17.0)
Lymphocytes Relative: 15 %
Lymphs Abs: 2 10*3/uL (ref 0.7–4.0)
MCH: 28.6 pg (ref 26.0–34.0)
MCHC: 35.5 g/dL (ref 30.0–36.0)
MCV: 80.5 fL (ref 78.0–100.0)
MONO ABS: 1.6 10*3/uL — AB (ref 0.1–1.0)
Monocytes Relative: 11 %
Neutro Abs: 9.8 10*3/uL — ABNORMAL HIGH (ref 1.7–7.7)
Neutrophils Relative %: 70 %
PLATELETS: 339 10*3/uL (ref 150–400)
RBC: 4.83 MIL/uL (ref 4.22–5.81)
RDW: 12.1 % (ref 11.5–15.5)
WBC: 13.9 10*3/uL — AB (ref 4.0–10.5)

## 2017-03-08 LAB — URINALYSIS, ROUTINE W REFLEX MICROSCOPIC
BILIRUBIN URINE: NEGATIVE
GLUCOSE, UA: NEGATIVE mg/dL
Hgb urine dipstick: NEGATIVE
Ketones, ur: NEGATIVE mg/dL
Leukocytes, UA: NEGATIVE
NITRITE: NEGATIVE
PH: 6 (ref 5.0–8.0)
Protein, ur: NEGATIVE mg/dL
SPECIFIC GRAVITY, URINE: 1.019 (ref 1.005–1.030)

## 2017-03-08 LAB — COMPREHENSIVE METABOLIC PANEL
ALBUMIN: 3.8 g/dL (ref 3.5–5.0)
ALK PHOS: 56 U/L (ref 38–126)
ALT: 21 U/L (ref 17–63)
AST: 14 U/L — AB (ref 15–41)
Anion gap: 9 (ref 5–15)
BILIRUBIN TOTAL: 0.6 mg/dL (ref 0.3–1.2)
BUN: 13 mg/dL (ref 6–20)
CALCIUM: 8.8 mg/dL — AB (ref 8.9–10.3)
CO2: 25 mmol/L (ref 22–32)
Chloride: 104 mmol/L (ref 101–111)
Creatinine, Ser: 0.96 mg/dL (ref 0.61–1.24)
GFR calc Af Amer: >60 mL/min (ref >60)
GLUCOSE: 91 mg/dL (ref 65–99)
Potassium: 3.4 mmol/L — ABNORMAL LOW (ref 3.5–5.1)
Sodium: 138 mmol/L (ref 135–145)
TOTAL PROTEIN: 7.2 g/dL (ref 6.5–8.1)

## 2017-03-08 LAB — OCCULT BLOOD X 1 CARD TO LAB, STOOL: FECAL OCCULT BLD: NEGATIVE

## 2017-03-08 LAB — LIPASE, BLOOD: LIPASE: 23 U/L (ref 11–51)

## 2017-03-08 MED ORDER — KETOROLAC TROMETHAMINE 30 MG/ML IJ SOLN
30.0000 mg | Freq: Once | INTRAMUSCULAR | Status: AC
  Administered 2017-03-08: 30 mg via INTRAVENOUS
  Filled 2017-03-08: qty 1

## 2017-03-08 MED ORDER — DIPHENHYDRAMINE HCL 50 MG/ML IJ SOLN
25.0000 mg | Freq: Once | INTRAMUSCULAR | Status: AC
  Administered 2017-03-08: 25 mg via INTRAVENOUS
  Filled 2017-03-08: qty 1

## 2017-03-08 MED ORDER — IOPAMIDOL (ISOVUE-300) INJECTION 61%
100.0000 mL | Freq: Once | INTRAVENOUS | Status: AC | PRN
  Administered 2017-03-08: 100 mL via INTRAVENOUS
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
## 2017-03-08 NOTE — ED Triage Notes (Signed)
Fever, chills right lower quadrant pain x 4 days. He has had a change in his bowel movements and pain in his lower abdomen when he urinates. He has been taking left over Cipro x 4 days. Hx of diverticulitis and kidney stones.

## 2017-03-09 MED ORDER — HYDROCODONE-ACETAMINOPHEN 5-325 MG PO TABS: 1.0000 | ORAL_TABLET | Freq: Four times a day (QID) | ORAL | 0 refills | Status: AC | PRN

## 2017-03-09 MED ORDER — METRONIDAZOLE 500 MG PO TABS: 500.0000 mg | ORAL_TABLET | Freq: Three times a day (TID) | ORAL | 0 refills | Status: AC

## 2017-03-09 MED ORDER — KETOROLAC TROMETHAMINE 30 MG/ML IJ SOLN
30.0000 mg | Freq: Once | INTRAMUSCULAR | Status: AC
  Administered 2017-03-09: 30 mg via INTRAVENOUS
  Filled 2017-03-09: qty 1

## 2017-03-09 MED ORDER — METRONIDAZOLE 500 MG PO TABS
500.0000 mg | ORAL_TABLET | Freq: Once | ORAL | Status: AC
  Administered 2017-03-09: 500 mg via ORAL
  Filled 2017-03-09: qty 1

## 2017-03-09 MED ORDER — CIPROFLOXACIN HCL 500 MG PO TABS: 500.0000 mg | ORAL_TABLET | Freq: Two times a day (BID) | ORAL | 0 refills | Status: AC

## 2017-03-09 NOTE — Discharge Instructions (Signed)
Please read instructions below. Take your antibiotics as prescribed until they are gone. You can take Norco as needed for pain. Do not take tylenol with this medication as there is tylenol in it. Do not drive or drink alcohol with this medication. You can take advil as needed for additional pain relief. Follow up with your PCP or gastroenterologist on Monday. Return to the ER for worsening pain, if you are unable to keep liquids down, or for new or concerning symptoms.

## 2018-08-28 IMAGING — CT CT ABD-PELV W/ CM
2 of 4 series · 16 of 46 positions shown, 18 images · IV contrast (iopamidol)
Comparison: 01/03/2012 CT.

CLINICAL DATA: 36-year-old male with acute right abdominal and
pelvic pain for 4 days.

EXAM:
CT ABDOMEN AND PELVIS WITH CONTRAST
TECHNIQUE: Multidetector CT imaging of the abdomen and pelvis was performed
using the standard protocol following bolus administration of
intravenous contrast.
CONTRAST:  100mL RINX4Y-BPP IOPAMIDOL (RINX4Y-BPP) INJECTION 61%

[Series 2: axial st · axial · 0.85mm/px · z∈[-564,-104]mm · 13 of 101 slices shown, 15 images]
[im 5/101  soft-tissue]
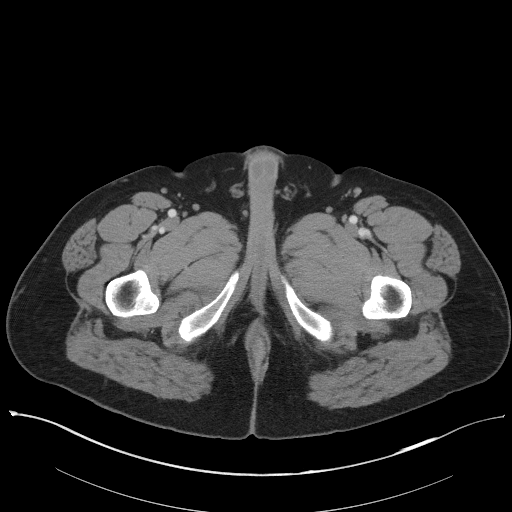
[im 5/101  bone]
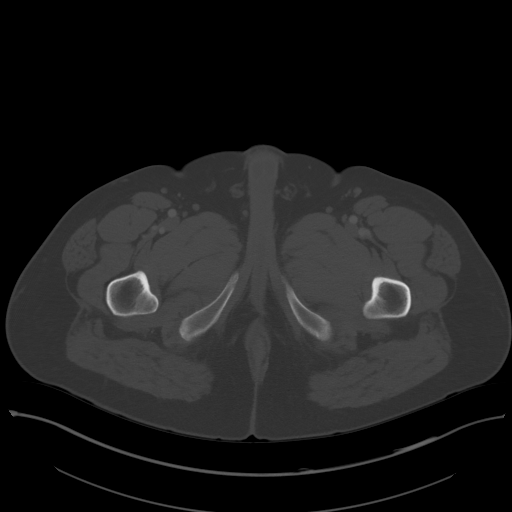
[im 13/101  soft-tissue]
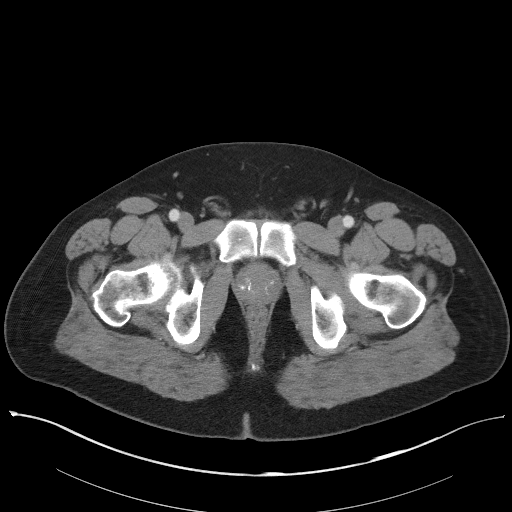
[im 21/101  soft-tissue]
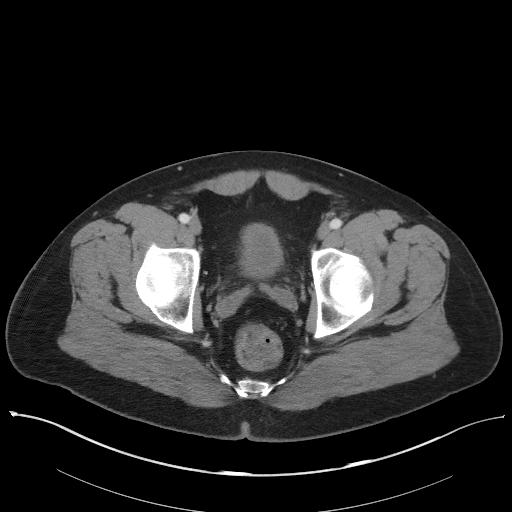
[im 29/101  soft-tissue]
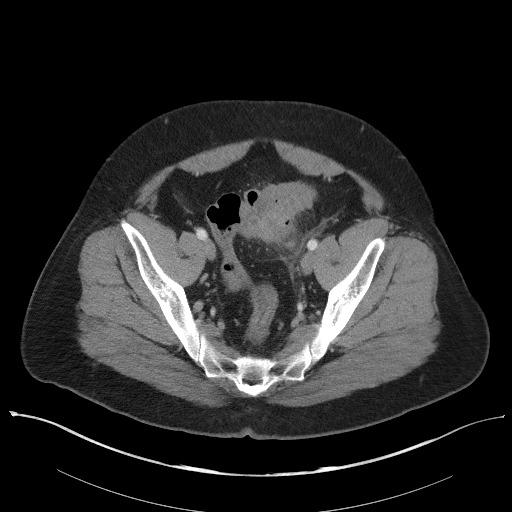
[im 37/101  soft-tissue]
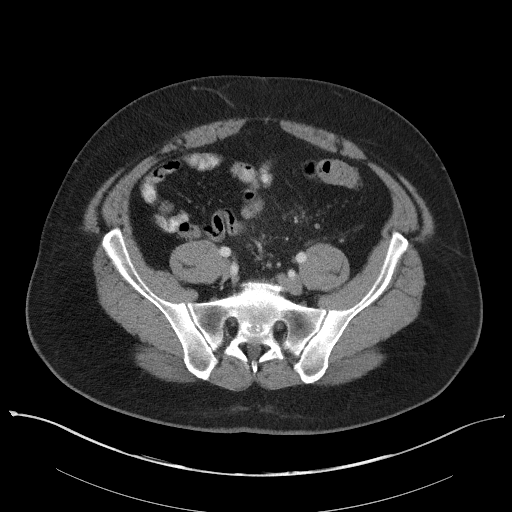
[im 45/101  soft-tissue]
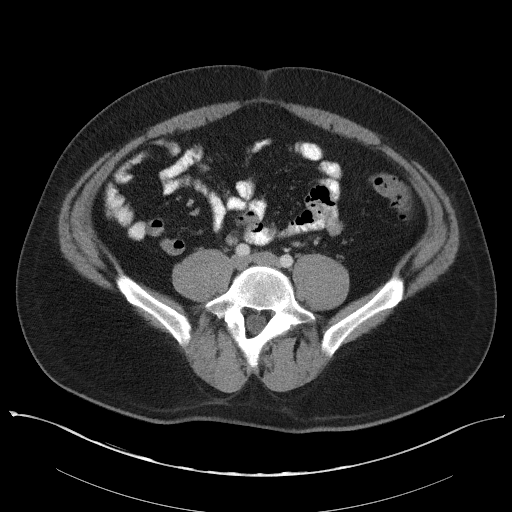
[im 53/101  soft-tissue]
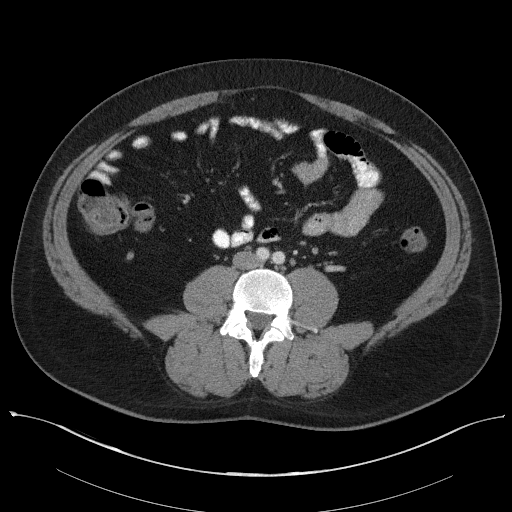
[im 57/101  soft-tissue]
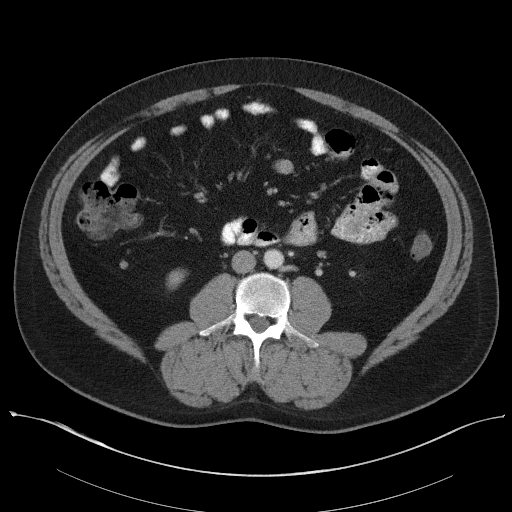
[im 65/101  soft-tissue]
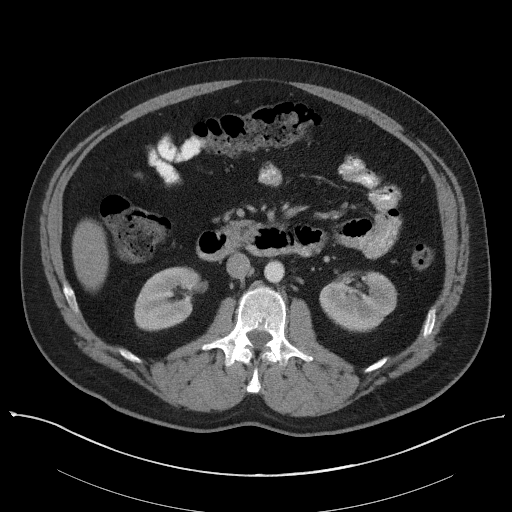
[im 65/101  bone]
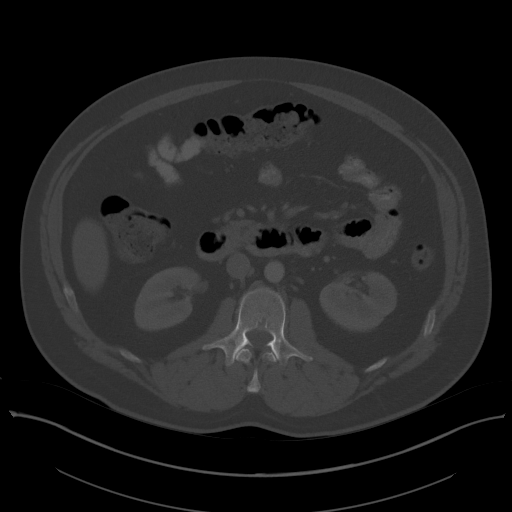
[im 73/101  soft-tissue]
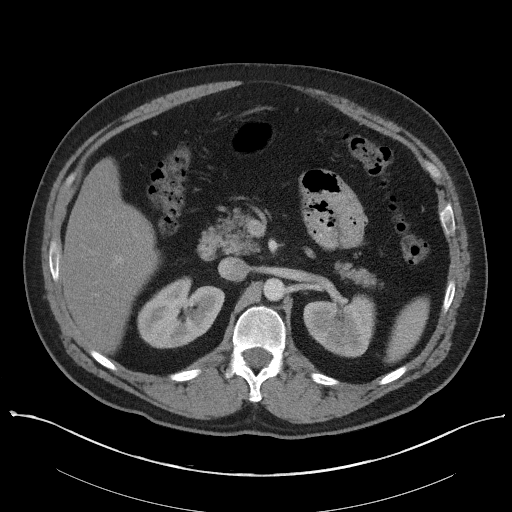
[im 81/101  soft-tissue]
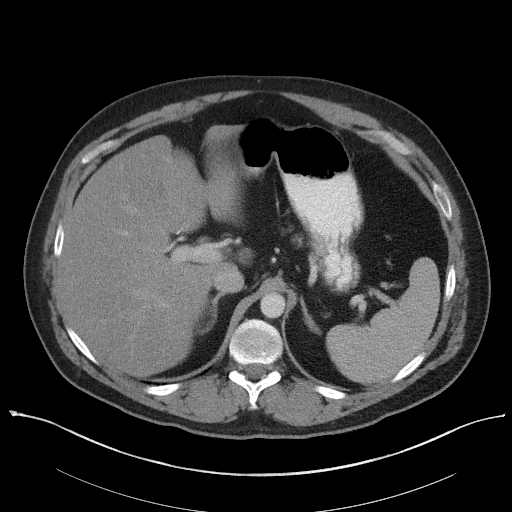
[im 89/101  soft-tissue]
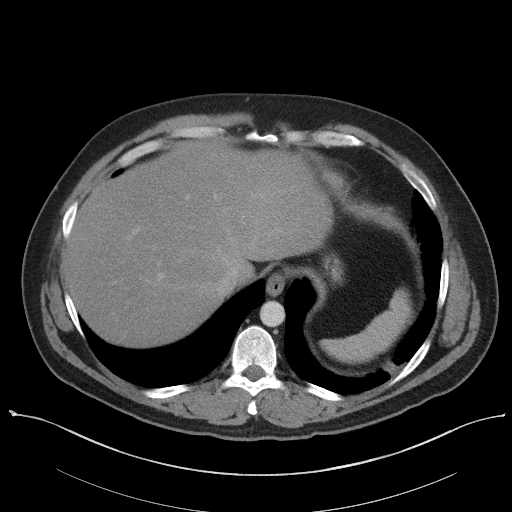
[im 97/101  soft-tissue]
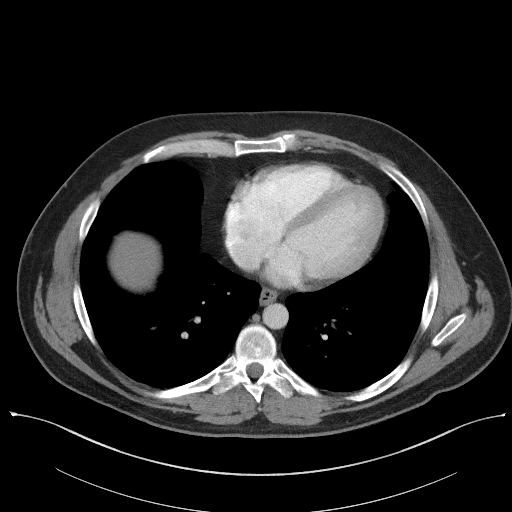

[Series 5: coronal st · coronal · 0.78mm/px · 3 of 110 slices shown]
[im 37/110  soft-tissue]
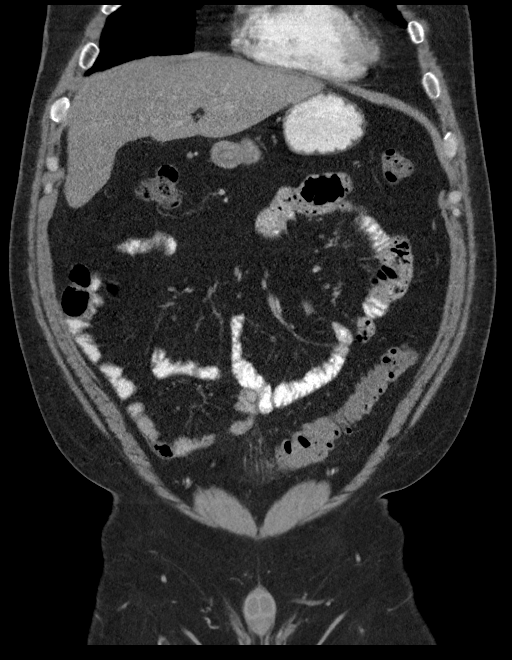
[im 49/110  soft-tissue]
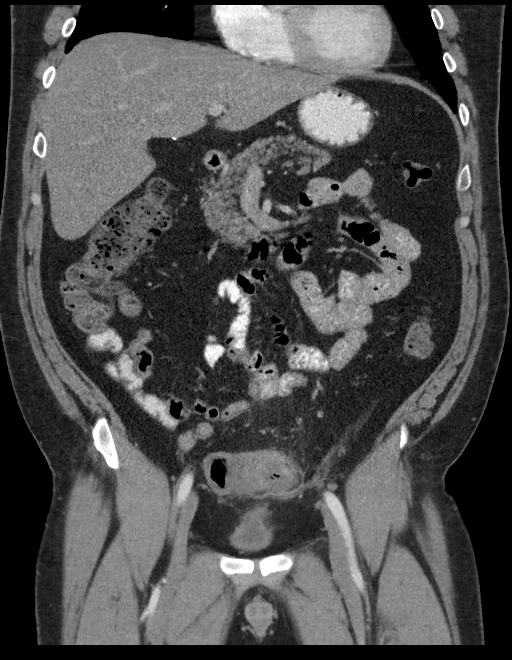
[im 61/110  soft-tissue]
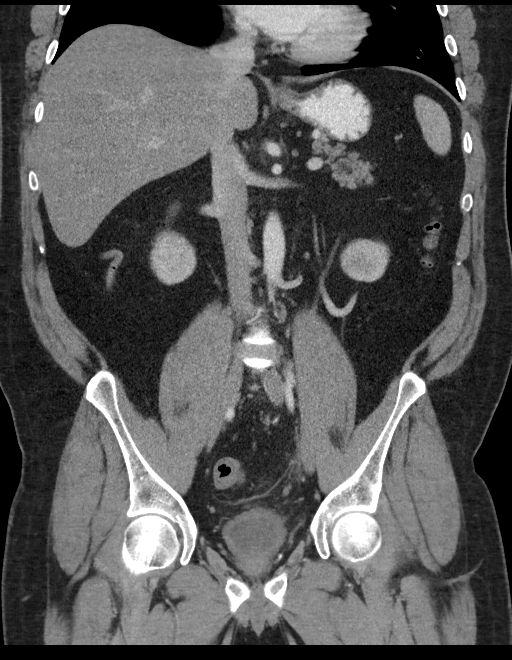

[16 of 46 positions shown; findings below may reference images not displayed]

FINDINGS: Lower chest: No acute abnormality. Mild bibasilar scarring again
noted.

Hepatobiliary: Hepatic steatosis identified. Patient is status post
cholecystectomy. No biliary dilatation.

Pancreas: Unremarkable

Spleen: Unremarkable

Adrenals/Urinary Tract: The kidneys, adrenal glands and bladder are
unremarkable.

Stomach/Bowel: Focal wall thickening of the mid sigmoid colon with
moderate adjacent inflammation likely represents diverticulitis.
There is no evidence of abscess, bowel obstruction or
pneumoperitoneum. The appendix is normal.

Vascular/Lymphatic: No significant vascular findings are present. No
enlarged abdominal or pelvic lymph nodes.

Reproductive: Prostate unchanged

Other: No ascites.

Musculoskeletal: No acute or significant abnormality.
IMPRESSION: Focal wall thickening of the mid sigmoid colon with moderate
adjacent inflammation likely representing diverticulitis. No
evidence of abscess, pneumoperitoneum or ascites.

Hepatic steatosis.

## 2022-07-09 ENCOUNTER — Other Ambulatory Visit: Payer: Self-pay | Admitting: Family

## 2022-07-09 DIAGNOSIS — M25552 Pain in left hip: Secondary | ICD-10-CM

## 2022-07-09 DIAGNOSIS — M5416 Radiculopathy, lumbar region: Secondary | ICD-10-CM

## 2022-07-11 ENCOUNTER — Ambulatory Visit
Admission: RE | Admit: 2022-07-11 | Discharge: 2022-07-11 | Disposition: A | Discharge status: Home / Self Care | Payer: 59 | Source: Ambulatory Visit | Attending: Family | Admitting: Family

## 2022-07-11 DIAGNOSIS — M25552 Pain in left hip: Secondary | ICD-10-CM

## 2022-07-11 DIAGNOSIS — M5416 Radiculopathy, lumbar region: Secondary | ICD-10-CM
